# Patient Record
Sex: Female | Born: 2000 | Race: White | Hispanic: No | Marital: Single | Smoking: Current every day smoker
Health system: Southern US, Community
[De-identification: ages and names within clinical notes are randomized; demographics above are authoritative.]

## PROBLEM LIST (undated history)

## (undated) DIAGNOSIS — I498 Other specified cardiac arrhythmias: Secondary | ICD-10-CM

## (undated) DIAGNOSIS — F988 Other specified behavioral and emotional disorders with onset usually occurring in childhood and adolescence: Secondary | ICD-10-CM

## (undated) DIAGNOSIS — S8992XA Unspecified injury of left lower leg, initial encounter: Secondary | ICD-10-CM

## (undated) DIAGNOSIS — Z8709 Personal history of other diseases of the respiratory system: Secondary | ICD-10-CM

## (undated) HISTORY — DX: Unspecified injury of left lower leg, initial encounter: S89.92XA

## (undated) HISTORY — PX: NO PAST SURGERIES: SHX2092

## (undated) HISTORY — DX: Other specified behavioral and emotional disorders with onset usually occurring in childhood and adolescence: F98.8

## (undated) HISTORY — DX: Other specified cardiac arrhythmias: I49.8

## (undated) HISTORY — PX: CHOLECYSTECTOMY: SHX55

## (undated) HISTORY — DX: Personal history of other diseases of the respiratory system: Z87.09

---
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
## 2020-01-14 DIAGNOSIS — M25572 Pain in left ankle and joints of left foot: Secondary | ICD-10-CM | POA: Diagnosis not present

## 2020-01-19 DIAGNOSIS — R197 Diarrhea, unspecified: Secondary | ICD-10-CM | POA: Diagnosis not present

## 2020-01-19 DIAGNOSIS — R112 Nausea with vomiting, unspecified: Secondary | ICD-10-CM | POA: Diagnosis not present

## 2020-02-09 ENCOUNTER — Inpatient Hospital Stay (HOSPITAL_COMMUNITY)
Admission: AD | Admit: 2020-02-09 | Discharge: 2020-02-09 | Disposition: A | Discharge status: Home / Self Care | Payer: Medicaid Other | Attending: Obstetrics and Gynecology | Admitting: Obstetrics and Gynecology

## 2020-02-09 ENCOUNTER — Inpatient Hospital Stay (HOSPITAL_COMMUNITY): Payer: Medicaid Other

## 2020-02-09 ENCOUNTER — Other Ambulatory Visit: Payer: Self-pay

## 2020-02-09 ENCOUNTER — Encounter (HOSPITAL_COMMUNITY): Payer: Self-pay | Admitting: Obstetrics and Gynecology

## 2020-02-09 DIAGNOSIS — R42 Dizziness and giddiness: Secondary | ICD-10-CM | POA: Insufficient documentation

## 2020-02-09 DIAGNOSIS — O26899 Other specified pregnancy related conditions, unspecified trimester: Secondary | ICD-10-CM

## 2020-02-09 DIAGNOSIS — O26891 Other specified pregnancy related conditions, first trimester: Secondary | ICD-10-CM | POA: Diagnosis not present

## 2020-02-09 DIAGNOSIS — Z3A00 Weeks of gestation of pregnancy not specified: Secondary | ICD-10-CM | POA: Diagnosis not present

## 2020-02-09 DIAGNOSIS — O99331 Smoking (tobacco) complicating pregnancy, first trimester: Secondary | ICD-10-CM | POA: Insufficient documentation

## 2020-02-09 DIAGNOSIS — Z6790 Unspecified blood type, Rh positive: Secondary | ICD-10-CM | POA: Insufficient documentation

## 2020-02-09 DIAGNOSIS — Z3A01 Less than 8 weeks gestation of pregnancy: Secondary | ICD-10-CM | POA: Diagnosis not present

## 2020-02-09 DIAGNOSIS — O3680X0 Pregnancy with inconclusive fetal viability, not applicable or unspecified: Secondary | ICD-10-CM | POA: Diagnosis not present

## 2020-02-09 DIAGNOSIS — Z79899 Other long term (current) drug therapy: Secondary | ICD-10-CM | POA: Diagnosis not present

## 2020-02-09 DIAGNOSIS — F17210 Nicotine dependence, cigarettes, uncomplicated: Secondary | ICD-10-CM | POA: Diagnosis not present

## 2020-02-09 DIAGNOSIS — R109 Unspecified abdominal pain: Secondary | ICD-10-CM

## 2020-02-09 LAB — COMPREHENSIVE METABOLIC PANEL
ALT: 18 U/L (ref 0–44)
AST: 17 U/L (ref 15–41)
Albumin: 3.8 g/dL (ref 3.5–5.0)
Alkaline Phosphatase: 44 U/L (ref 38–126)
Anion gap: 10 (ref 5–15)
BUN: 5 mg/dL — ABNORMAL LOW (ref 6–20)
CO2: 22 mmol/L (ref 22–32)
Calcium: 8.5 mg/dL — ABNORMAL LOW (ref 8.9–10.3)
Chloride: 106 mmol/L (ref 98–111)
Creatinine, Ser: 0.72 mg/dL (ref 0.44–1.00)
GFR calc Af Amer: >60 mL/min (ref >60)
GFR calc non Af Amer: >60 mL/min (ref >60)
Glucose, Bld: 120 mg/dL — ABNORMAL HIGH (ref 70–99)
Potassium: 3.6 mmol/L (ref 3.5–5.1)
Sodium: 138 mmol/L (ref 135–145)
Total Bilirubin: 0.4 mg/dL (ref 0.3–1.2)
Total Protein: 6.4 g/dL — ABNORMAL LOW (ref 6.5–8.1)

## 2020-02-09 LAB — POCT PREGNANCY, URINE: Preg Test, Ur: POSITIVE — AB

## 2020-02-09 LAB — ABO/RH: ABO/RH(D): O POS

## 2020-02-09 LAB — CBC
HCT: 39 % (ref 36.0–46.0)
Hemoglobin: 12.7 g/dL (ref 12.0–15.0)
MCH: 28.9 pg (ref 26.0–34.0)
MCHC: 32.6 g/dL (ref 30.0–36.0)
MCV: 88.6 fL (ref 80.0–100.0)
Platelets: 200 10*3/uL (ref 150–400)
RBC: 4.4 MIL/uL (ref 3.87–5.11)
RDW: 12.2 % (ref 11.5–15.5)
WBC: 6.6 10*3/uL (ref 4.0–10.5)
nRBC: 0 % (ref 0.0–0.2)

## 2020-02-09 LAB — WET PREP, GENITAL
Clue Cells Wet Prep HPF POC: NONE SEEN
Sperm: NONE SEEN
Trich, Wet Prep: NONE SEEN
WBC, Wet Prep HPF POC: NONE SEEN
Yeast Wet Prep HPF POC: NONE SEEN

## 2020-02-09 LAB — HCG, QUANTITATIVE, PREGNANCY: hCG, Beta Chain, Quant, S: 355 m[IU]/mL — ABNORMAL HIGH (ref <5)

## 2020-02-09 NOTE — MAU Note (Signed)
Pt is G1P0 who has had abdominal since Tuesday evening. Denies bleeding, has clear vaginal discharge. Also reporting feeling lightheaded since yesterday. This happens at work and she's on her feet 10 hours a day.

## 2020-02-09 NOTE — Discharge Instructions (Signed)
First Trimester of Pregnancy The first trimester of pregnancy is from week 1 until the end of week 13 (months 1 through 3). A week after a sperm fertilizes an egg, the egg will implant on the wall of the uterus. This embryo will begin to develop into a baby. Genes from you and your partner will form the baby. The female genes will determine whether the baby will be a boy or a girl. At 6-8 weeks, the eyes and face will be formed, and the heartbeat can be seen on ultrasound. At the end of 12 weeks, all the baby's organs will be formed. Now that you are pregnant, you will want to do everything you can to have a healthy baby. Two of the most important things are to get good prenatal care and to follow your health care provider's instructions. Prenatal care is all the medical care you receive before the baby's birth. This care will help prevent, find, and treat any problems during the pregnancy and childbirth. Body changes during your first trimester Your body goes through many changes during pregnancy. The changes vary from woman to woman.  You may gain or lose a couple of pounds at first.  You may feel sick to your stomach (nauseous) and you may throw up (vomit). If the vomiting is uncontrollable, call your health care provider.  You may tire easily.  You may develop headaches that can be relieved by medicines. All medicines should be approved by your health care provider.  You may urinate more often. Painful urination may mean you have a bladder infection.  You may develop heartburn as a result of your pregnancy.  You may develop constipation because certain hormones are causing the muscles that push stool through your intestines to slow down.  You may develop hemorrhoids or swollen veins (varicose veins).  Your breasts may begin to grow larger and become tender. Your nipples may stick out more, and the tissue that surrounds them (areola) may become darker.  Your gums may bleed and may be  sensitive to brushing and flossing.  Dark spots or blotches (chloasma, mask of pregnancy) may develop on your face. This will likely fade after the baby is born.  Your menstrual periods will stop.  You may have a loss of appetite.  You may develop cravings for certain kinds of food.  You may have changes in your emotions from day to day, such as being excited to be pregnant or being concerned that something may go wrong with the pregnancy and baby.  You may have more vivid and strange dreams.  You may have changes in your hair. These can include thickening of your hair, rapid growth, and changes in texture. Some women also have hair loss during or after pregnancy, or hair that feels dry or thin. Your hair will most likely return to normal after your baby is born. What to expect at prenatal visits During a routine prenatal visit:  You will be weighed to make sure you and the baby are growing normally.  Your blood pressure will be taken.  Your abdomen will be measured to track your baby's growth.  The fetal heartbeat will be listened to between weeks 10 and 14 of your pregnancy.  Test results from any previous visits will be discussed. Your health care provider may ask you:  How you are feeling.  If you are feeling the baby move.  If you have had any abnormal symptoms, such as leaking fluid, bleeding, severe headaches, or abdominal   cramping.  If you are using any tobacco products, including cigarettes, chewing tobacco, and electronic cigarettes.  If you have any questions. Other tests that may be performed during your first trimester include:  Blood tests to find your blood type and to check for the presence of any previous infections. The tests will also be used to check for low iron levels (anemia) and protein on red blood cells (Rh antibodies). Depending on your risk factors, or if you previously had diabetes during pregnancy, you may have tests to check for high blood sugar  that affects pregnant women (gestational diabetes).  Urine tests to check for infections, diabetes, or protein in the urine.  An ultrasound to confirm the proper growth and development of the baby.  Fetal screens for spinal cord problems (spina bifida) and Down syndrome.  HIV (human immunodeficiency virus) testing. Routine prenatal testing includes screening for HIV, unless you choose not to have this test.  You may need other tests to make sure you and the baby are doing well. Follow these instructions at home: Medicines  Follow your health care provider's instructions regarding medicine use. Specific medicines may be either safe or unsafe to take during pregnancy.  Take a prenatal vitamin that contains at least 600 micrograms (mcg) of folic acid.  If you develop constipation, try taking a stool softener if your health care provider approves. Eating and drinking   Eat a balanced diet that includes fresh fruits and vegetables, whole grains, good sources of protein such as meat, eggs, or tofu, and low-fat dairy. Your health care provider will help you determine the amount of weight gain that is right for you.  Avoid raw meat and uncooked cheese. These carry germs that can cause birth defects in the baby.  Eating four or five small meals rather than three large meals a day may help relieve nausea and vomiting. If you start to feel nauseous, eating a few soda crackers can be helpful. Drinking liquids between meals, instead of during meals, also seems to help ease nausea and vomiting.  Limit foods that are high in fat and processed sugars, such as fried and sweet foods.  To prevent constipation: ? Eat foods that are high in fiber, such as fresh fruits and vegetables, whole grains, and beans. ? Drink enough fluid to keep your urine clear or pale yellow. Activity  Exercise only as directed by your health care provider. Most women can continue their usual exercise routine during  pregnancy. Try to exercise for 30 minutes at least 5 days a week. Exercising will help you: ? Control your weight. ? Stay in shape. ? Be prepared for labor and delivery.  Experiencing pain or cramping in the lower abdomen or lower back is a good sign that you should stop exercising. Check with your health care provider before continuing with normal exercises.  Try to avoid standing for long periods of time. Move your legs often if you must stand in one place for a long time.  Avoid heavy lifting.  Wear low-heeled shoes and practice good posture.  You may continue to have sex unless your health care provider tells you not to. Relieving pain and discomfort  Wear a good support bra to relieve breast tenderness.  Take warm sitz baths to soothe any pain or discomfort caused by hemorrhoids. Use hemorrhoid cream if your health care provider approves.  Rest with your legs elevated if you have leg cramps or low back pain.  If you develop varicose veins in   your legs, wear support hose. Elevate your feet for 15 minutes, 3-4 times a day. Limit salt in your diet. Prenatal care  Schedule your prenatal visits by the twelfth week of pregnancy. They are usually scheduled monthly at first, then more often in the last 2 months before delivery.  Write down your questions. Take them to your prenatal visits.  Keep all your prenatal visits as told by your health care provider. This is important. Safety  Wear your seat belt at all times when driving.  Make a list of emergency phone numbers, including numbers for family, friends, the hospital, and police and fire departments. General instructions  Ask your health care provider for a referral to a local prenatal education class. Begin classes no later than the beginning of month 6 of your pregnancy.  Ask for help if you have counseling or nutritional needs during pregnancy. Your health care provider can offer advice or refer you to specialists for help  with various needs.  Do not use hot tubs, steam rooms, or saunas.  Do not douche or use tampons or scented sanitary pads.  Do not cross your legs for long periods of time.  Avoid cat litter boxes and soil used by cats. These carry germs that can cause birth defects in the baby and possibly loss of the fetus by miscarriage or stillbirth.  Avoid all smoking, herbs, alcohol, and medicines not prescribed by your health care provider. Chemicals in these products affect the formation and growth of the baby.  Do not use any products that contain nicotine or tobacco, such as cigarettes and e-cigarettes. If you need help quitting, ask your health care provider. You may receive counseling support and other resources to help you quit.  Schedule a dentist appointment. At home, brush your teeth with a soft toothbrush and be gentle when you floss. Contact a health care provider if:  You have dizziness.  You have mild pelvic cramps, pelvic pressure, or nagging pain in the abdominal area.  You have persistent nausea, vomiting, or diarrhea.  You have a bad smelling vaginal discharge.  You have pain when you urinate.  You notice increased swelling in your face, hands, legs, or ankles.  You are exposed to fifth disease or chickenpox.  You are exposed to [place] measles (rubella) and have never had it. Get help right away if:  You have a fever.  You are leaking fluid from your vagina.  You have spotting or bleeding from your vagina.  You have severe abdominal cramping or pain.  You have rapid weight gain or loss.  You vomit blood or material that looks like coffee grounds.  You develop a severe headache.  You have shortness of breath.  You have any kind of trauma, such as from a fall or a car accident. Summary  The first trimester of pregnancy is from week 1 until the end of week 13 (months 1 through 3).  Your body goes through many changes during pregnancy. The changes vary from  woman to woman.  You will have routine prenatal visits. During those visits, your health care provider will examine you, discuss any test results you may have, and talk with you about how you are feeling. This information is not intended to replace advice given to you by your health care provider. Make sure you discuss any questions you have with your health care provider. Document Revised: 08/21/2017 Document Reviewed: 08/20/2016 Elsevier Patient Education  2020 [employer].  Ectopic Pregnancy  An ectopic pregnancy is when the fertilized egg attaches (implants) outside the uterus. Most ectopic pregnancies occur in one of the tubes where eggs travel from the ovary to the uterus (fallopian tubes), but the implanting can occur in other locations. In rare cases, ectopic pregnancies occur on the ovary, intestine, pelvis, abdomen, or cervix. In an ectopic pregnancy, the fertilized egg does not have the ability to develop into a normal, healthy baby. A ruptured ectopic pregnancy is one in which tearing or bursting of a fallopian tube causes internal bleeding. Often, there is intense lower abdominal pain, and vaginal bleeding sometimes occurs. Having an ectopic pregnancy can be life-threatening. If this dangerous condition is not treated, it can lead to blood loss, shock, or even death. What are the causes? The most common cause of this condition is damage to one of the fallopian tubes. A fallopian tube may be narrowed or blocked, and that keeps the fertilized egg from reaching the uterus. What increases the risk? This condition is more likely to develop in women of childbearing age who have different levels of risk. The levels of risk can be divided into three categories. High risk  You have gone through infertility treatment.  You have had an ectopic pregnancy before.  You have had surgery on the fallopian tubes, or another surgical procedure, such as an abortion.  You have had  surgery to have the fallopian tubes tied (tubal ligation).  You have problems or diseases of the fallopian tubes.  You have been exposed to diethylstilbestrol (DES). This medicine was used until 1971, and it had effects on babies whose mothers took the medicine.  You become pregnant while using an IUD (intrauterine device) for birth control. Moderate risk  You have a history of infertility.  You have had an STI (sexually transmitted infection).  You have a history of pelvic inflammatory disease (PID).  You have scarring from endometriosis.  You have multiple sexual partners.  You smoke. Low risk  You have had pelvic surgery.  You use vaginal douches.  You became sexually active before age 36. What are the signs or symptoms? Common symptoms of this condition include normal pregnancy symptoms, such as missing a period, nausea, tiredness, abdominal pain, breast tenderness, and bleeding. However, ectopic pregnancy will have additional symptoms, such as:  Pain with intercourse.  Irregular vaginal bleeding or spotting.  Cramping or pain on one side or in the lower abdomen.  Fast heartbeat, low blood pressure, and sweating.  Passing out while having a bowel movement. Symptoms of a ruptured ectopic pregnancy and internal bleeding may include:  Sudden, severe pain in the abdomen and pelvis.  Dizziness, weakness, light-headedness, or fainting.  Pain in the shoulder or neck area. How is this diagnosed? This condition is diagnosed by:  A pelvic exam to locate pain or a mass in the abdomen.  A pregnancy test. This blood test checks for the presence as well as the specific level of pregnancy hormone in the bloodstream.  Ultrasound. This is performed if a pregnancy test is positive. In this test, a probe is inserted into the vagina. The probe will detect a fetus, possibly in a location other than the uterus.  Taking a sample of uterus tissue (dilation and curettage, or  D&C).  Surgery to perform a visual exam of the inside of the abdomen using a thin, lighted tube that has a tiny camera on the end (laparoscope).  Culdocentesis. This procedure involves inserting a needle at the top  of the vagina, behind the uterus. If blood is present in this area, it may indicate that a fallopian tube is torn. How is this treated? This condition is treated with medicine or surgery. Medicine  An injection of a medicine (methotrexate) may be given to cause the pregnancy tissue to be absorbed. This medicine may save your fallopian tube. It may be given if: ? The diagnosis is made early, with no signs of active bleeding. ? The fallopian tube has not ruptured. ? You are considered to be a good candidate for the medicine. Usually, pregnancy hormone blood levels are checked after methotrexate treatment. This is to be sure that the medicine is effective. It may take 4-6 weeks for the pregnancy to be absorbed. Most pregnancies will be absorbed by 3 weeks. Surgery  A laparoscope may be used to remove the pregnancy tissue.  If severe internal bleeding occurs, a larger cut (incision) may be made in the lower abdomen (laparotomy) to remove the fetus and placenta. This is done to stop the bleeding.  Part or all of the fallopian tube may be removed (salpingectomy) along with the fetus and placenta. The fallopian tube may also be repaired during the surgery.  In very rare circumstances, removal of the uterus (hysterectomy) may be required.  After surgery, pregnancy hormone testing may be done to be sure that there is no pregnancy tissue left. Whether your treatment is medicine or surgery, you may receive a Rho (D) immune globulin shot to prevent problems with any future pregnancy. This shot may be given if:  You are Rh-negative and the baby's father is Rh-positive.  You are Rh-negative and you do not know the Rh type of the baby's father. Follow these instructions at home:  Rest and  limit your activity after the procedure for as long as told by your health care provider.  Until your health care provider says that it is safe: ? Do not lift anything that is heavier than 10 lb (4.5 kg), or the limit that your health care provider tells you. ? Avoid physical exercise and any movement that requires effort (is strenuous).  To help prevent constipation: ? Eat a healthy diet that includes fruits, vegetables, and whole grains. ? Drink 6-8 glasses of water per day. Get help right away if:  You develop worsening pain that is not relieved by medicine.  You have: ? A fever or chills. ? Vaginal bleeding. ? Redness and swelling at the incision site. ? Nausea and vomiting.  You feel dizzy or weak.  You feel light-headed or you faint. This information is not intended to replace advice given to you by your health care provider. Make sure you discuss any questions you have with your health care provider. Document Revised: 08/21/2017 Document Reviewed: 04/09/2016 Elsevier Patient Education  2020 [employer].       Miscarriage A miscarriage is the loss of an unborn baby (fetus) before the 20th week of pregnancy. Most miscarriages happen during the first 3 months of pregnancy. Sometimes, a miscarriage can happen before a woman knows that she is pregnant. Having a miscarriage can be an emotional experience. If you have had a miscarriage, talk with your health care provider about any questions you may have about miscarrying, the grieving process, and your plans for future pregnancy. What are the causes? A miscarriage may be caused by:  Problems with the genes or chromosomes of the fetus. These problems make it impossible for the baby to develop normally. They are often  the result of random errors that occur early in the development of the baby, and are not passed from parent to child (not inherited).  Infection of the cervix or uterus.  Conditions that affect hormone balance  in the body.  Problems with the cervix, such as the cervix opening and thinning before pregnancy is at term (cervical insufficiency).  Problems with the uterus. These may include: ? A uterus with an abnormal shape. ? Fibroids in the uterus. ? Congenital abnormalities. These are problems that were present at birth.  Certain medical conditions.  Smoking, drinking alcohol, or using drugs.  Injury (trauma). In many cases, the cause of a miscarriage is not known. What are the signs or symptoms? Symptoms of this condition include:  Vaginal bleeding or spotting, with or without cramps or pain.  Pain or cramping in the abdomen or lower back.  Passing fluid, tissue, or blood clots from the vagina. How is this diagnosed? This condition may be diagnosed based on:  A physical exam.  Ultrasound.  Blood tests.  Urine tests. How is this treated? Treatment for a miscarriage is sometimes not necessary if you naturally pass all the tissue that was in your uterus. If necessary, this condition may be treated with:  Dilation and curettage (D&C). This is a procedure in which the cervix is stretched open and the lining of the uterus (endometrium) is scraped. This is done only if tissue from the fetus or placenta remains in the body (incomplete miscarriage).  Medicines, such as: ? Antibiotic medicine, to treat infection. ? Medicine to help the body pass any remaining tissue. ? Medicine to reduce (contract) the size of the uterus. These medicines may be given if you have a lot of bleeding. If you have Rh negative blood and your baby was Rh positive, you will need a shot of a medicine called Rh immunoglobulinto protect your future babies from Rh blood problems. "Rh-negative" and "Rh-positive" refer to whether or not the blood has a specific protein found on the surface of red blood cells (Rh factor). Follow these instructions at home: Medicines   Take over-the-counter and prescription medicines  only as told by your health care provider.  If you were prescribed antibiotic medicine, take it as told by your health care provider. Do not stop taking the antibiotic even if you start to feel better.  Do not take NSAIDs, such as aspirin and ibuprofen, unless they are approved by your health care provider. These medicines can cause bleeding. Activity  Rest as directed. Ask your health care provider what activities are safe for you.  Have someone help with home and family responsibilities during this time. General instructions  Keep track of the number of sanitary pads you use each day and how soaked (saturated) they are. Write down this information.  Monitor the amount of tissue or blood clots that you pass from your vagina. Save any large amounts of tissue for your health care provider to examine.  Do not use tampons, douche, or have sex until your health care provider approves.  To help you and your partner with the process of grieving, talk with your health care provider or seek counseling.  When you are ready, meet with your health care provider to discuss any important steps you should take for your health. Also, discuss steps you should take to have a healthy pregnancy in the future.  Keep all follow-up visits as told by your health care provider. This is important. Where to find more information  The [employer]: www.acog.org  [place] Department of Health and [employer] Office of Women's Health: http://hoffman.com/www.womenshealth.gov Contact a health care provider if:  You have a fever or chills.  You have a foul smelling vaginal discharge.  You have more bleeding instead of less. Get help right away if:  You have severe cramps or pain in your back or abdomen.  You pass blood clots or tissue from your vagina that is walnut-sized or larger.  You soak more than 1 regular sanitary pad in an hour.  You become light-headed or weak.  You pass  out.  You have feelings of sadness that take over your thoughts, or you have thoughts of hurting yourself. Summary  Most miscarriages happen in the first 3 months of pregnancy. Sometimes miscarriage happens before a woman even knows that she is pregnant.  Follow your health care provider's instruction for home care. Keep all follow-up appointments.  To help you and your partner with the process of grieving, talk with your health care provider or seek counseling. This information is not intended to replace advice given to you by your health care provider. Make sure you discuss any questions you have with your health care provider. Document Revised: 12/31/2018 Document Reviewed: 10/14/2016 Elsevier Patient Education  2020 [employer] Inc.         Abdominal Pain During Pregnancy  Abdominal pain is common during pregnancy, and has many possible causes. Some causes are more serious than others, and sometimes the cause is not known. Abdominal pain can be a sign that labor is starting. It can also be caused by normal growth and stretching of muscles and ligaments during pregnancy. Always tell your health care provider if you have any abdominal pain. Follow these instructions at home:  Do not have sex or put anything in your vagina until your pain goes away completely.  Get plenty of rest until your pain improves.  Drink enough fluid to keep your urine pale yellow.  Take over-the-counter and prescription medicines only as told by your health care provider.  Keep all follow-up visits as told by your health care provider. This is important. Contact a health care provider if:  Your pain continues or gets worse after resting.  You have lower abdominal pain that: ? Comes and goes at regular intervals. ? Spreads to your back. ? Is similar to menstrual cramps.  You have pain or burning when you urinate. Get help right away if:  You have a fever or chills.  You have vaginal  bleeding.  You are leaking fluid from your vagina.  You are passing tissue from your vagina.  You have vomiting or diarrhea that lasts for more than 24 hours.  Your baby is moving less than usual.  You feel very weak or faint.  You have shortness of breath.  You develop severe pain in your upper abdomen. Summary  Abdominal pain is common during pregnancy, and has many possible causes.  If you experience abdominal pain during pregnancy, tell your health care provider right away.  Follow your health care provider's home care instructions and keep all follow-up visits as directed. This information is not intended to replace advice given to you by your health care provider. Make sure you discuss any questions you have with your health care provider. Document Revised: 12/27/2018 Document Reviewed: 12/11/2016 Elsevier Patient Education  2020 [employer]  Safe Medications in Pregnancy    Acne: Benzoyl Peroxide Salicylic Acid  Backache/Headache: Tylenol: 2 regular strength every 4 hours OR              2 Extra strength every 6 hours  Colds/Coughs/Allergies: Benadryl (alcohol free) 25 mg every 6 hours as needed Breath right strips Claritin Cepacol throat lozenges Chloraseptic throat spray Cold-Eeze- up to three times per day Cough drops, alcohol free Flonase (by prescription only) Guaifenesin Mucinex Robitussin DM (plain only, alcohol free) Saline nasal spray/drops Sudafed (pseudoephedrine) & Actifed ** use only after [redacted] weeks gestation and if you do not have high blood pressure Tylenol Vicks Vaporub Zinc lozenges Zyrtec   Constipation: Colace Ducolax suppositories Fleet enema Glycerin suppositories Metamucil Milk of magnesia Miralax Senokot Smooth move tea  Diarrhea: Kaopectate Imodium A-D  *NO pepto Bismol  Hemorrhoids: Anusol Anusol HC Preparation H Tucks  Indigestion: Tums Maalox Mylanta Zantac   Pepcid  Insomnia: Benadryl (alcohol free) 25mg  every 6 hours as needed Tylenol PM Unisom, no Gelcaps  Leg Cramps: Tums MagGel  Nausea/Vomiting:  Bonine Dramamine Emetrol Ginger extract Sea bands Meclizine  Nausea medication to take during pregnancy:  Unisom (doxylamine succinate 25 mg tablets) Take one tablet daily at bedtime. If symptoms are not adequately controlled, the dose can be increased to a maximum recommended dose of two tablets daily (1/2 tablet in the morning, 1/2 tablet mid-afternoon and one at bedtime). Vitamin B6 100mg  tablets. Take one tablet twice a day (up to 200 mg per day).  Skin Rashes: Aveeno products Benadryl cream or 25mg  every 6 hours as needed Calamine Lotion 1% cortisone cream  Yeast infection: Gyne-lotrimin 7 Monistat 7   **If taking multiple medications, please check labels to avoid duplicating the same active ingredients **take medication as directed on the label ** Do not exceed 4000 mg of tylenol in 24 hours **Do not take medications that contain aspirin or ibuprofen         Eating Plan for Pregnant Women While you are pregnant, your body requires additional nutrition to help support your growing baby. You also have a higher need for some vitamins and minerals, such as folic acid, calcium, iron, and vitamin D. Eating a healthy, well-balanced diet is very important for your health and your baby's health. Your need for extra calories varies for the three 64-month segments of your pregnancy (trimesters). For most women, it is recommended to consume:  150 extra calories a day during the first trimester.  300 extra calories a day during the second trimester.  300 extra calories a day during the third trimester. What are tips for following this plan?   Do not try to lose weight or go on a diet during pregnancy.  Limit your overall intake of foods that have "empty calories." These are foods that have little nutritional value, such as  sweets, desserts, candies, and sugar-sweetened beverages.  Eat a variety of foods (especially fruits and vegetables) to get a full range of vitamins and minerals.  Take a prenatal vitamin to help meet your additional vitamin and mineral needs during pregnancy, specifically for folic acid, iron, calcium, and vitamin D.  Remember to stay active. Ask your health care provider what types of exercise and activities are safe for you.  Practice good food safety and cleanliness. Wash your hands before you eat and after you prepare raw meat. Wash all fruits and vegetables well before peeling or eating. Taking these actions can help to prevent food-borne illnesses that can be  very dangerous to your baby, such as listeriosis. Ask your health care provider for more information about listeriosis. What does 150 extra calories look like? Healthy options that provide 150 extra calories each day could be any of the following:  6-8 oz (170-230 g) of plain low-fat yogurt with  cup of berries.  1 apple with 2 teaspoons (11 g) of peanut butter.  Cut-up vegetables with  cup (60 g) of hummus.  8 oz (230 mL) or 1 cup of low-fat chocolate milk.  1 stick of string cheese with 1 medium orange.  1 peanut butter and jelly sandwich that is made with one slice of whole-wheat bread and 1 tsp (5 g) of peanut butter. For 300 extra calories, you could eat two of those healthy options each day. What is a healthy amount of weight to gain? The right amount of weight gain for you is based on your BMI before you became pregnant. If your BMI:  Was less than 18 (underweight), you should gain 28-40 lb (13-18 kg).  Was 18-24.9 (normal), you should gain 25-35 lb (11-16 kg).  Was 25-29.9 (overweight), you should gain 15-25 lb (7-11 kg).  Was 30 or greater (obese), you should gain 11-20 lb (5-9 kg). What if I am having twins or multiples? Generally, if you are carrying twins or multiples:  You may need to eat 300-600 extra  calories a day.  The recommended range for total weight gain is 25-54 lb (11-25 kg), depending on your BMI before pregnancy.  Talk with your health care provider to find out about nutritional needs, weight gain, and exercise that is right for you. What foods can I eat?  Fruits All fruits. Eat a variety of colors and types of fruit. Remember to wash your fruits well before peeling or eating. Vegetables All vegetables. Eat a variety of colors and types of vegetables. Remember to wash your vegetables well before peeling or eating. Grains All grains. Choose whole grains, such as whole-wheat bread, oatmeal, or brown rice. Meats and other protein foods Lean meats, including chicken, [place], fish, and lean cuts of beef, veal, or pork. If you eat fish or seafood, choose options that are higher in omega-3 fatty acids and lower in mercury, such as salmon, herring, mussels, trout, sardines, pollock, shrimp, crab, and lobster. Tofu. Tempeh. Beans. Eggs. Peanut butter and other nut butters. Make sure that all meats, poultry, and eggs are cooked to food-safe temperatures or "well-done." Two or more servings of fish are recommended each week in order to get the most benefits from omega-3 fatty acids that are found in seafood. Choose fish that are lower in mercury. You can find more information online:  GuamGaming.ch Dairy Pasteurized milk and milk alternatives (such as almond milk). Pasteurized yogurt and pasteurized cheese. Cottage cheese. Sour cream. Beverages Water. Juices that contain 100% fruit juice or vegetable juice. Caffeine-free teas and decaffeinated coffee. Drinks that contain caffeine are okay to drink, but it is better to avoid caffeine. Keep your total caffeine intake to less than 200 mg each day (which is 12 oz or 355 mL of coffee, tea, or soda) or the limit as told by your health care provider. Fats and oils Fats and oils are okay to include in moderation. Sweets and desserts Sweets and  desserts are okay to include in moderation. Seasoning and other foods All pasteurized condiments. The items listed above may not be a complete list of foods and beverages you can eat. Contact a dietitian for more information.  What foods are not recommended? Fruits Unpasteurized fruit juices. Vegetables Raw (unpasteurized) vegetable juices. Meats and other protein foods Lunch meats, bologna, hot dogs, or other deli meats. (If you must eat those meats, reheat them until they are steaming hot.) Refrigerated pat, meat spreads from a meat counter, smoked seafood that is found in the refrigerated section of a store. Raw or undercooked meats, poultry, and eggs. Raw fish, such as sushi or sashimi. Fish that have high mercury content, such as tilefish, shark, swordfish, and king mackerel. To learn more about mercury in fish, talk with your health care provider or look for online resources, such as:  PumpkinSearch.com.ee Dairy Raw (unpasteurized) milk and any foods that have raw milk in them. Soft cheeses, such as feta, queso blanco, queso fresco, Brie, Camembert cheeses, blue-veined cheeses, and Panela cheese (unless it is made with pasteurized milk, which must be stated on the label). Beverages Alcohol. Sugar-sweetened beverages, such as sodas, teas, or energy drinks. Seasoning and other foods Homemade fermented foods and drinks, such as pickles, sauerkraut, or kombucha drinks. (Store-bought pasteurized versions of these are okay.) Salads that are made in a store or deli, such as ham salad, chicken salad, egg salad, tuna salad, and seafood salad. The items listed above may not be a complete list of foods and beverages you should avoid. Contact a dietitian for more information. Where to find more information To calculate the number of calories you need based on your height, weight, and activity level, you can use an online calculator such as:  PackageNews.is To calculate how much weight  you should gain during pregnancy, you can use an online pregnancy weight gain calculator such as:  http://jones-berg.com/ Summary  While you are pregnant, your body requires additional nutrition to help support your growing baby.  Eat a variety of foods, especially fruits and vegetables to get a full range of vitamins and minerals.  Practice good food safety and cleanliness. Wash your hands before you eat and after you prepare raw meat. Wash all fruits and vegetables well before peeling or eating. Taking these actions can help to prevent food-borne illnesses, such as listeriosis, that can be very dangerous to your baby.  Do not eat raw meat or fish. Do not eat fish that have high mercury content, such as tilefish, shark, swordfish, and king mackerel. Do not eat unpasteurized (raw) dairy.  Take a prenatal vitamin to help meet your additional vitamin and mineral needs during pregnancy, specifically for folic acid, iron, calcium, and vitamin D. This information is not intended to replace advice given to you by your health care provider. Make sure you discuss any questions you have with your health care provider. Document Revised: 01/27/2019 Document Reviewed: 06/05/2017 Elsevier Patient Education  2020 [employer].

## 2020-02-09 NOTE — MAU Provider Note (Signed)
History     CSN: [id number]  Arrival date and time: 02/09/20 7741   First Provider Initiated Contact with Patient 02/09/20 1040      Chief Complaint  Patient presents with  . Abdominal Cramping  . Dizziness   Kayla Church is a 19 y.o. G1P0 at [redacted]w[redacted]d who presents to MAU for abdominal cramping.  Onset: 3 days Location: epigastric region  Duration: 3 days Character: "period-like cramps" but more often, pt reports this is the area she feels cramping with her menstrual cycles Aggravating/Associated: none/lightheadedness when standing at work for 10 hours Relieving: none Treatment: Tylenol - sometimes Severity: 0/10 - pt reports pain is not present at this time  Pt denies VB, vaginal discharge/odor/itching. Pt denies N/V, constipation, diarrhea, or urinary problems. Pt denies fever, chills, fatigue, sweating or changes in appetite. Pt denies SOB or chest pain. Pt denies dizziness, HA, weakness.  Problems this pregnancy include: pt has not yet been seen. Allergies? NKDA Current medications/supplements? PNVs Prenatal care provider? [facility], NOB Tuesday 02/14/2020   OB History    Gravida  1   Para      Term      Preterm      AB      Living        SAB      TAB      Ectopic      Multiple      Live Births              Past Medical History:  Diagnosis Date  . ADD (attention deficit disorder)    with inadequate control  . History of URI (upper respiratory infection)   . Left knee injury 4/12   bruise   . Sinus arrhythmia     History reviewed. No pertinent surgical history.  Family History  Problem Relation Age of Onset  . Bipolar disorder Father     Social History   Tobacco Use  . Smoking status: Current Every Day Smoker    Packs/day: 0.25    Types: Cigarettes  . Smokeless tobacco: Never Used  . Tobacco comment: pt reports she has had a total of 6 cigarettes since 02/07/20  Substance Use Topics  . Alcohol use: No  . Drug use: Yes     Types: Marijuana    Comment: pt reports she stopped 5/18    Allergies: No Known Allergies  Medications Prior to Admission  Medication Sig Dispense Refill Last Dose  . Prenatal Vit-Fe Fumarate-FA (PRENATAL MULTIVITAMIN) TABS tablet Take 1 tablet by mouth daily at 12 noon.     . citalopram (CELEXA) 20 MG tablet Take 1 tablet (20 mg total) by mouth daily. 30 tablet 5   . levonorgestrel-ethinyl estradiol (AVIANE) 0.1-20 MG-MCG tablet Take 1 tablet by mouth daily. 1 Package 11   . lisdexamfetamine (VYVANSE) 50 MG capsule Take 1 capsule (50 mg total) by mouth daily. 30 capsule 0   . lisdexamfetamine (VYVANSE) 50 MG capsule Take 1 capsule (50 mg total) by mouth daily. TAKE 1 CAPSULE IN THE MORNING 30 capsule 0   . lisdexamfetamine (VYVANSE) 50 MG capsule Take 1 capsule (50 mg total) by mouth every morning. 30 capsule 0     Review of Systems  Constitutional: Negative for chills, diaphoresis, fatigue and fever.  Eyes: Negative for visual disturbance.  Respiratory: Negative for shortness of breath.   Cardiovascular: Negative for chest pain.  Gastrointestinal: Positive for abdominal pain. Negative for constipation, diarrhea, nausea and vomiting.  Genitourinary:  Negative for dysuria, flank pain, frequency, pelvic pain, urgency, vaginal bleeding and vaginal discharge.  Neurological: Positive for light-headedness. Negative for dizziness, weakness and headaches.   Physical Exam   Blood pressure 122/67, pulse 72, temperature 98.4 F (36.9 C), temperature source Oral, resp. rate 19, height 5\' 2"  (1.575 m), weight 71.7 kg, last menstrual period 01/02/2020, SpO2 100 %.  Patient Vitals for the past 24 hrs:  BP Temp Temp src Pulse Resp SpO2 Height Weight  02/09/20 1021 122/67 98.4 F (36.9 C) Oral 72 19 100 % 5\' 2"  (1.575 m) 71.7 kg   Physical Exam  Constitutional: She is oriented to person, place, and time. She appears well-developed and well-nourished. No distress.  HENT:  Head: Normocephalic  and atraumatic.  Respiratory: Effort normal.  GI: Soft. She exhibits no distension and no mass. There is no abdominal tenderness. There is no rebound and no guarding.  Neurological: She is alert and oriented to person, place, and time.  Skin: Skin is warm and dry. She is not diaphoretic.  Psychiatric: She has a normal mood and affect. Her behavior is normal. Judgment and thought content normal.  Blind swabs collected by RN.  Results for orders placed or performed during the hospital encounter of 02/09/20 (from the past 24 hour(s))  Pregnancy, urine POC     Status: Abnormal   Collection Time: 02/09/20 10:13 AM  Result Value Ref Range   Preg Test, Ur POSITIVE (A) NEGATIVE  Wet prep, genital     Status: None   Collection Time: 02/09/20 10:41 AM   Specimen: PATH Cytology Cervicovaginal Ancillary Only  Result Value Ref Range   Yeast Wet Prep HPF POC NONE SEEN NONE SEEN   Trich, Wet Prep NONE SEEN NONE SEEN   Clue Cells Wet Prep HPF POC NONE SEEN NONE SEEN   WBC, Wet Prep HPF POC NONE SEEN NONE SEEN   Sperm NONE SEEN   CBC     Status: None   Collection Time: 02/09/20 10:53 AM  Result Value Ref Range   WBC 6.6 4.0 - 10.5 K/uL   RBC 4.40 3.87 - 5.11 MIL/uL   Hemoglobin 12.7 12.0 - 15.0 g/dL   HCT 02/11/20 02/11/20 - 46.2 %   MCV 88.6 80.0 - 100.0 fL   MCH 28.9 26.0 - 34.0 pg   MCHC 32.6 30.0 - 36.0 g/dL   RDW 70.3 50.0 - 93.8 %   Platelets 200 150 - 400 K/uL   nRBC 0.0 0.0 - 0.2 %  Comprehensive metabolic panel     Status: Abnormal   Collection Time: 02/09/20 10:53 AM  Result Value Ref Range   Sodium 138 135 - 145 mmol/L   Potassium 3.6 3.5 - 5.1 mmol/L   Chloride 106 98 - 111 mmol/L   CO2 22 22 - 32 mmol/L   Glucose, Bld 120 (H) 70 - 99 mg/dL   BUN 5 (L) 6 - 20 mg/dL   Creatinine, Ser 99.3 0.44 - 1.00 mg/dL   Calcium 8.5 (L) 8.9 - 10.3 mg/dL   Total Protein 6.4 (L) 6.5 - 8.1 g/dL   Albumin 3.8 3.5 - 5.0 g/dL   AST 17 15 - 41 U/L   ALT 18 0 - 44 U/L   Alkaline Phosphatase 44 38 -  126 U/L   Total Bilirubin 0.4 0.3 - 1.2 mg/dL   GFR calc non Af Amer >60 >60 mL/min   GFR calc Af Amer >60 >60 mL/min   Anion gap 10 5 - 15  hCG, quantitative, pregnancy     Status: Abnormal   Collection Time: 02/09/20 10:53 AM  Result Value Ref Range   hCG, Beta Chain, Quant, S 355 (H) <5 mIU/mL  ABO/Rh     Status: None   Collection Time: 02/09/20 10:53 AM  Result Value Ref Range   ABO/RH(D) O POS    No rh immune globuloin      NOT A RH IMMUNE GLOBULIN CANDIDATE, PT RH POSITIVE Performed at [facility], [address], [place], [place] [address]    US OB LESS THAN 14 WEEKS WITH OB TRANSVAGINAL  Result Date: 02/09/2020 CLINICAL DATA:  Abdominal pain in first trimester of pregnancy; LMP 01/02/2020; quantitative beta HCG = 355 today EXAM: OBSTETRIC <14 WK [place] AND TRANSVAGINAL OB US TECHNIQUE: Both transabdominal and transvaginal ultrasound examinations were performed for complete evaluation of the gestation as well as the maternal uterus, adnexal regions, and pelvic cul-de-sac. Transvaginal technique was performed to assess early pregnancy. COMPARISON:  None FINDINGS: Intrauterine gestational sac: Present, single, tiny Yolk sac:  Not visualized Embryo:  Not visualized Cardiac Activity: N/A Heart Rate: N/A  bpm MSD: 1.8 mm    w   d  ** below chart range Subchorionic hemorrhage:  None visualized. Maternal uterus/adnexae: Uterus otherwise normal appearance, anteverted. No endometrial fluid. RIGHT ovary normal size and morphology 2.5 x 3.2 x 2.6 cm. LEFT ovary normal size and morphology 2.3 x 1.7 x 2.4 cm. Trace free pelvic fluid potentially physiologic. No adnexal masses. IMPRESSION: Tiny gestational sac seen within the uterus. No fetal pole identified to establish viability; may consider follow-up ultrasound in 14 days if clinically indicated to establish viability. Remainder of exam unremarkable. Electronically Signed   By: Ulyses Southward M.D.   On: 02/09/2020 12:31    MAU Course   Procedures  MDM -r/o ectopic -UA: pending at time of discharge -CBC: WNL -CMP: WNL -[place]: tiny gestational sac seen, pregnancy of unknown location -hCG: 355 -ABO: O Positive -WetPrep: WNL -GC/CT collected -pt discharged to home in stable condition  Orders Placed This Encounter  Procedures  . Wet prep, genital    Standing Status:   Standing    Number of Occurrences:   1  . US OB LESS THAN 14 WEEKS WITH OB TRANSVAGINAL    Standing Status:   Standing    Number of Occurrences:   1    Order Specific Question:   Symptom/Reason for Exam    Answer:   Abdominal pain in pregnancy [[id number]]  . CBC    Standing Status:   Standing    Number of Occurrences:   1  . Comprehensive metabolic panel    Standing Status:   Standing    Number of Occurrences:   1  . hCG, quantitative, pregnancy    Standing Status:   Standing    Number of Occurrences:   1  . Urinalysis, Routine w reflex microscopic    Standing Status:   Standing    Number of Occurrences:   1  . Pregnancy, urine POC    Standing Status:   Standing    Number of Occurrences:   1  . ABO/Rh    Standing Status:   Standing    Number of Occurrences:   1  . Discharge patient    Order Specific Question:   Discharge disposition    Answer:   01-Home or Self Care [1]    Order Specific Question:   Discharge patient date    Answer:  02/09/2020   No orders of the defined types were placed in this encounter.  Assessment and Plan   1. Pregnancy of unknown anatomic location   2. Abdominal pain in pregnancy   3. Blood type, Rh positive    Allergies as of 02/09/2020   No Known Allergies     Medication List    STOP taking these medications   levonorgestrel-ethinyl estradiol 0.1-20 MG-MCG tablet Commonly known as: Aviane     TAKE these medications   citalopram 20 MG tablet Commonly known as: CeleXA Take 1 tablet (20 mg total) by mouth daily.   lisdexamfetamine 50 MG capsule Commonly known as: Vyvanse Take 1 capsule (50 mg  total) by mouth every morning.   lisdexamfetamine 50 MG capsule Commonly known as: Vyvanse Take 1 capsule (50 mg total) by mouth daily. TAKE 1 CAPSULE IN THE MORNING   lisdexamfetamine 50 MG capsule Commonly known as: Vyvanse Take 1 capsule (50 mg total) by mouth daily.   prenatal multivitamin Tabs tablet Take 1 tablet by mouth daily at 12 noon.       -will call with culture results, if positive -discussed appropriate nutrition and hydration in pregnancy and ways to relieve dizziness when standing at work for extended periods, including frequent rest breaks with elevating legs and use of compression socks/stockings -safe meds in pregnancy list given -discussed early pregnancy vs. Ectopic vs. SAB -strict ectopic precautions discussed -return MAU precautions given -pt discharged to home in stable condition   Elmyra Ricks E Linsie Lupo 02/09/2020, 1:01 PM

## 2020-02-10 LAB — GC/CHLAMYDIA PROBE AMP (~~LOC~~) NOT AT ARMC
Chlamydia: NEGATIVE
Comment: NEGATIVE
Comment: NORMAL
Neisseria Gonorrhea: NEGATIVE

## 2020-02-11 ENCOUNTER — Other Ambulatory Visit: Payer: Self-pay

## 2020-02-11 ENCOUNTER — Inpatient Hospital Stay (HOSPITAL_COMMUNITY)
Admission: AD | Admit: 2020-02-11 | Discharge: 2020-02-11 | Disposition: A | Discharge status: Home / Self Care | Payer: Medicaid Other | Attending: Obstetrics and Gynecology | Admitting: Obstetrics and Gynecology

## 2020-02-11 DIAGNOSIS — O0281 Inappropriate change in quantitative human chorionic gonadotropin (hCG) in early pregnancy: Secondary | ICD-10-CM

## 2020-02-11 DIAGNOSIS — Z3A01 Less than 8 weeks gestation of pregnancy: Secondary | ICD-10-CM

## 2020-02-11 DIAGNOSIS — Z3401 Encounter for supervision of normal first pregnancy, first trimester: Secondary | ICD-10-CM | POA: Insufficient documentation

## 2020-02-11 DIAGNOSIS — E349 Endocrine disorder, unspecified: Secondary | ICD-10-CM

## 2020-02-11 LAB — HCG, QUANTITATIVE, PREGNANCY: hCG, Beta Chain, Quant, S: 837 m[IU]/mL — ABNORMAL HIGH (ref <5)

## 2020-02-11 NOTE — Discharge Instructions (Signed)

## 2020-02-11 NOTE — MAU Provider Note (Signed)
History   Chief Complaint:  follow up  Kayla Church is  19 y.o. G1P0 Patient's last menstrual period was 01/02/2020 (exact date).. Patient is here for follow up of quantitative HCG and ongoing surveillance of pregnancy status. She is [redacted]w[redacted]d weeks gestation  by LMP.    Since her last visit, the patient is without new complaint. The patient reports bleeding as  none now.  She denies any pain.  General ROS:  negative  Her previous Quantitative HCG values are:  Results for Kayla, Church (MRN [id number]) as of 02/11/2020 11:34  Ref. Range 02/09/2020 10:53  HCG, Beta Chain, Quant, S Latest Ref Range: <5 mIU/mL 355 (H)   Physical Exam   Blood pressure 113/68, pulse 84, temperature 98.3 F (36.8 C), temperature source Oral, resp. rate 16, last menstrual period 01/02/2020, SpO2 100 %.  Focused Gynecological Exam: examination not indicated  Labs: Results for orders placed or performed during the hospital encounter of 02/11/20 (from the past 24 hour(s))  hCG, quantitative, pregnancy   Collection Time: 02/11/20 10:18 AM  Result Value Ref Range   hCG, Beta Chain, Quant, S 837 (H) <5 mIU/mL    Assessment:   1. Elevated serum hCG    Appropriate rise in HCG over 48hrs  Plan: -Discharge home in stable condition -First trimester precautions discussed -Patient advised to follow-up with [facility] in 2 weeks for a repeat u/s -Patient may return to MAU as needed or if her condition were to change or worsen  Rolm Bookbinder, CNM 02/11/2020, 11:33 AM

## 2020-02-11 NOTE — MAU Note (Addendum)
Kayla Church is a 19 y.o. at [redacted]w[redacted]d here in MAU reporting:  For follow up blood work related to previous visit in MAU on 02/09/20. Denies pain or bleeding.  Vitals:   02/11/20 0950  BP: 113/68  Pulse: 84  Resp: 16  Temp: 98.3 F (36.8 C)  SpO2: 100%      Phlebotomy called for lab draw.

## 2020-02-14 ENCOUNTER — Encounter: Payer: Medicaid Other | Admitting: Adult Health

## 2020-02-26 ENCOUNTER — Encounter (HOSPITAL_COMMUNITY): Payer: Self-pay

## 2020-02-26 ENCOUNTER — Other Ambulatory Visit: Payer: Self-pay

## 2020-02-26 ENCOUNTER — Emergency Department (HOSPITAL_COMMUNITY)
Admission: EM | Admit: 2020-02-26 | Discharge: 2020-02-26 | Disposition: A | Discharge status: Home / Self Care | Payer: Medicaid Other | Attending: Emergency Medicine | Admitting: Emergency Medicine

## 2020-02-26 DIAGNOSIS — F17210 Nicotine dependence, cigarettes, uncomplicated: Secondary | ICD-10-CM | POA: Diagnosis not present

## 2020-02-26 DIAGNOSIS — S91114A Laceration without foreign body of right lesser toe(s) without damage to nail, initial encounter: Secondary | ICD-10-CM | POA: Diagnosis not present

## 2020-02-26 DIAGNOSIS — Y9389 Activity, other specified: Secondary | ICD-10-CM | POA: Diagnosis not present

## 2020-02-26 DIAGNOSIS — S91111A Laceration without foreign body of right great toe without damage to nail, initial encounter: Secondary | ICD-10-CM | POA: Insufficient documentation

## 2020-02-26 DIAGNOSIS — W268XXA Contact with other sharp object(s), not elsewhere classified, initial encounter: Secondary | ICD-10-CM | POA: Diagnosis not present

## 2020-02-26 DIAGNOSIS — Z79899 Other long term (current) drug therapy: Secondary | ICD-10-CM | POA: Diagnosis not present

## 2020-02-26 DIAGNOSIS — S91119A Laceration without foreign body of unspecified toe without damage to nail, initial encounter: Secondary | ICD-10-CM

## 2020-02-26 DIAGNOSIS — Y92828 Other wilderness area as the place of occurrence of the external cause: Secondary | ICD-10-CM | POA: Diagnosis not present

## 2020-02-26 DIAGNOSIS — Y999 Unspecified external cause status: Secondary | ICD-10-CM | POA: Insufficient documentation

## 2020-02-26 MED ORDER — CEPHALEXIN 500 MG PO CAPS
500.0000 mg | ORAL_CAPSULE | Freq: Four times a day (QID) | ORAL | 0 refills | Status: DC
Start: 2020-02-26 — End: 2020-03-10

## 2020-02-26 MED ORDER — LIDOCAINE HCL (PF) 2 % IJ SOLN
5.0000 mL | Freq: Once | INTRAMUSCULAR | Status: AC
  Administered 2020-02-26: 5 mL

## 2020-02-26 MED ORDER — LIDOCAINE HCL (PF) 2 % IJ SOLN
INTRAMUSCULAR | Status: AC
  Filled 2020-02-26: qty 10

## 2020-02-26 NOTE — ED Triage Notes (Signed)
Patient c/o laceration to each toe on right foot. Per patient cut toes on metal piece while trying to get back onto make shift boat. Patient unsure of last tetanus vaccination. Bleeding controled. Foot dressed with gauze and kerlix in triage. Patient is two months pregnant.

## 2020-02-26 NOTE — Discharge Instructions (Addendum)
Have your sutures removed in 10 days.  Keep your wound clean and dry,  Until a good scab forms - you may then wash gently twice daily with mild soap and water, but dry completely after.  Get rechecked for any sign of infection (redness,  Swelling,  Increased pain or drainage of purulent fluid).   Wear the post op shoe to protect your toes.   You have prescribed an antibiotic - given this was [place], you need this to help prevent infection.  This medicine is safe in pregnancy.

## 2020-02-27 ENCOUNTER — Ambulatory Visit (INDEPENDENT_AMBULATORY_CARE_PROVIDER_SITE_OTHER): Payer: Medicaid Other | Admitting: *Deleted

## 2020-02-27 ENCOUNTER — Ambulatory Visit
Admission: RE | Admit: 2020-02-27 | Discharge: 2020-02-27 | Disposition: A | Discharge status: Home / Self Care | Payer: Medicaid Other | Source: Ambulatory Visit

## 2020-02-27 DIAGNOSIS — Z3A01 Less than 8 weeks gestation of pregnancy: Secondary | ICD-10-CM | POA: Diagnosis not present

## 2020-02-27 DIAGNOSIS — E349 Endocrine disorder, unspecified: Secondary | ICD-10-CM | POA: Diagnosis not present

## 2020-02-27 DIAGNOSIS — O3680X0 Pregnancy with inconclusive fetal viability, not applicable or unspecified: Secondary | ICD-10-CM | POA: Diagnosis not present

## 2020-02-27 DIAGNOSIS — Z712 Person consulting for explanation of examination or test findings: Secondary | ICD-10-CM

## 2020-02-27 NOTE — Progress Notes (Signed)
[place] results reviewed with Dr. Adrian Blackwater. She was informed of results showing normal IUP with embryo and cardiac activity. Pictures which were provided by radiology were given to pt. She plans prenatal care @ [facility]. She was advised to go to MAU if she develops heavy vaginal bleeding or abdominal/pelvic pain. Pt voiced understanding of all information and instructions given.

## 2020-02-28 NOTE — Progress Notes (Signed)
Chart reviewed - agree with CMA/RN documentation.  ° °

## 2020-02-29 ENCOUNTER — Telehealth: Payer: Self-pay | Admitting: Nurse Practitioner

## 2020-02-29 NOTE — ED Provider Notes (Signed)
[facility] EMERGENCY DEPARTMENT Provider Note   CSN: [id number] Arrival date & time: 02/26/20  1741     History Chief Complaint  Patient presents with  . Laceration    Kayla Church is a 19 y.o. female presenting with lacerations to 3 of her toes on her right foot, incurred [place], catching the foot against a sharp metal edge.  She reports immediate pain of the toes and moderate bleeding but now well controlled after applying dressing application.  She is current with her tetanus status.  She is newly pregnant, anticipating establishing prenatal care this week in [place].    HPI     Past Medical History:  Diagnosis Date  . ADD (attention deficit disorder)    with inadequate control  . History of URI (upper respiratory infection)   . Left knee injury 4/12   bruise   . Sinus arrhythmia     Patient Active Problem List   Diagnosis Date Noted  . Depression, major, single episode, complete remission ([place]) 05/31/2019  . Drug-induced insomnia ([place]) 04/27/2018  . 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 No pertinent surgical history.   OB History    Gravida  1   Para      Term      Preterm      AB      Living        SAB      TAB      Ectopic      Multiple      Live Births              Family History  Problem Relation Age of Onset  . Bipolar disorder Father     Social History   Tobacco Use  . Smoking status: Current Every Day Smoker    Packs/day: 0.25    Types: Cigarettes  . Smokeless tobacco: Never Used  . Tobacco comment: pt reports she has had a total of 6 cigarettes since 02/07/20  Substance Use Topics  . Alcohol use: No  . Drug use: Yes    Types: Marijuana    Comment: pt reports she stopped 5/18    Home Medications Prior to Admission medications   Medication Sig Start Date End Date Taking? Authorizing Provider  cephALEXin (KEFLEX) 500  MG capsule Take 1 capsule (500 mg total) by mouth 4 (four) times daily. 02/26/20   Evalee Jefferson, PA-C  citalopram (CELEXA) 20 MG tablet Take 1 tablet (20 mg total) by mouth daily. 05/31/19   Hassell Done, Mary-Margaret, FNP  lisdexamfetamine (VYVANSE) 50 MG capsule Take 1 capsule (50 mg total) by mouth daily. 07/29/19 08/28/19  Chevis Pretty, FNP  Prenatal Vit-Fe Fumarate-FA (PRENATAL MULTIVITAMIN) TABS tablet Take 1 tablet by mouth daily at 12 noon.    [provider]    Allergies    Patient has no known allergies.  Review of Systems   Review of Systems  Constitutional: Negative for chills and fever.  Musculoskeletal: Negative for arthralgias.  Skin: Positive for wound.  Neurological: Negative for weakness and numbness.  All other systems reviewed and are negative.   Physical Exam Updated Vital Signs BP 133/85   Pulse 96   Temp 97.8 F (36.6 C) (Oral)   Resp 18   Ht 5\' 2"  (1.575 m)   Wt 68 kg   LMP 01/02/2020 (Exact Date)   SpO2 100%   BMI 27.44 kg/m  Physical Exam Constitutional:      Appearance: She is well-developed.  HENT:     Head: Normocephalic.  Cardiovascular:     Rate and Rhythm: Normal rate.  Pulmonary:     Effort: Pulmonary effort is normal.  Musculoskeletal:        General: No swelling. Normal range of motion.  Skin:    Findings: Laceration present.     Comments: Right foot:  1 cm linear laceration lateral great toe next to nail plate which is intact, superficial.  2 cm deep flap laceration of the 4th toe, through the subcutaneous layer, but no muscle or bone appreciated.  1 cm more superficial flap laceration of the 3rd lateral toe. All hemostatic. Sensation intact.  Can flex/ext toes without deficit.   Neurological:     Mental Status: She is alert and oriented to person, place, and time.     Sensory: No sensory deficit.     ED Results / Procedures / Treatments   Labs (all labs ordered are listed, but only abnormal results are displayed) Labs  Reviewed - No data to display  EKG None  Radiology   Procedures Procedures (including critical care time)  LACERATION REPAIR #1 Great toe Performed by: Burgess Amor Authorized by: Burgess Amor Consent: Verbal consent obtained. Risks and benefits: risks, benefits and alternatives were discussed Consent given by: patient Patient identity confirmed: provided demographic data Prepped and Draped in normal sterile fashion Wound explored  Laceration Location: right great toe lateral to nail plate which is intact  Laceration Length: 1cm  No Foreign Bodies seen or palpated  Anesthesia: local infiltration  Local anesthetic: lidocaine 2% without epinephrine  Anesthetic total: 3 ml  Irrigation method: syringe Amount of cleaning: copious using wound cleaning spray - Safe Cleanse  Skin closure: ethilon 4-0  Number of sutures: 3  Technique: simple interupted  Patient tolerance: Patient tolerated the procedure well with no immediate complications.  LACERATION REPAIR #2 right 4th toe, large flap laceration  Complicated wound. Performed by: Burgess Amor Authorized by: Burgess Amor Consent: Verbal consent obtained. Risks and benefits: risks, benefits and alternatives were discussed Consent given by: patient Patient identity confirmed: provided demographic data Prepped and Draped in normal sterile fashion Wound explored  Laceration Location: right 4th toe  Laceration Length: 2 cm  No Foreign Bodies seen or palpated  Anesthesia: digital block  Local anesthetic: lidocaine 2% without epinephrine  Anesthetic total: 2 ml  Irrigation method: syringe Amount of cleaning: copious using wound clean spray Skin closure: ethilon 4-0  Number of sutures: 5.  The plantar aspect of the wound was very difficult to reach using suture technique, this edge was  Approximated using sterile strips.   Technique: simple interrupted  Patient tolerance: Patient tolerated the procedure well with no  immediate complications.  In addition, there was a smaller, superficial flap laceration along the lateral edge of the right 3rd toe, not amenable to sutures - steri strips placed.     Medications Ordered in ED Medications  lidocaine HCl (PF) (XYLOCAINE) 2 % injection 5 mL (5 mLs Other Given 02/26/20 1949)    ED Course  I have reviewed the triage vital signs and the nursing notes.  Pertinent labs & imaging results that were available during my care of the patient were reviewed by me and considered in my medical decision making (see chart for details).    MDM Rules/Calculators/A&P  Wound care instructions given.  Pt advised to have sutures removed in 10 days,  Return here or see pcp sooner for any signs of infection including redness, swelling, worse pain or drainage of pus.  She was placed on keflex given this wound was incurred while lake swimming.  She is currently pregnant so cipro contraindicated.     Final Clinical Impression(s) / ED Diagnoses Final diagnoses:  Laceration of toe of right foot without foreign body present or damage to nail, unspecified toe, initial encounter    Rx / DC Orders ED Discharge Orders         Ordered    cephALEXin (KEFLEX) 500 MG capsule  4 times daily     02/26/20 2121           Burgess Amor, PA-C 02/29/20 0032    Bethann Berkshire, MD 02/29/20 0710

## 2020-02-29 NOTE — Telephone Encounter (Signed)
Patient aware ok if they fall off just do not pull off and we will see her at her appointment

## 2020-03-03 DIAGNOSIS — Z5189 Encounter for other specified aftercare: Secondary | ICD-10-CM | POA: Diagnosis not present

## 2020-03-06 ENCOUNTER — Ambulatory Visit: Payer: Medicaid Other | Admitting: Nurse Practitioner

## 2020-03-08 ENCOUNTER — Ambulatory Visit (INDEPENDENT_AMBULATORY_CARE_PROVIDER_SITE_OTHER): Payer: Medicaid Other | Admitting: Nurse Practitioner

## 2020-03-08 ENCOUNTER — Other Ambulatory Visit: Payer: Self-pay

## 2020-03-08 ENCOUNTER — Encounter: Payer: Self-pay | Admitting: Nurse Practitioner

## 2020-03-08 VITALS — BP 88/54 | HR 68 | Temp 96.8°F | Resp 20 | Ht 62.0 in | Wt 162.0 lb

## 2020-03-08 DIAGNOSIS — F325 Major depressive disorder, single episode, in full remission: Secondary | ICD-10-CM | POA: Diagnosis not present

## 2020-03-08 DIAGNOSIS — Z4802 Encounter for removal of sutures: Secondary | ICD-10-CM

## 2020-03-08 NOTE — Assessment & Plan Note (Signed)
Patient is a 19 year old female who presents to clinic today for suture removal of laceration on the great toe and fifth metatarsal right foot.  Patient sustained injury [place]  Foot was caught in a sharp metal edge, this happened February 26, 2020.  Patient is not reporting any pain today or sign and symptom of infection.  Provided education and printed handout given.  Sutures removed, patient tolerated procedure well.  No signs or symptoms of infection on foot.  No swelling but mild tenderness when touched.  Advised patient to receive tetanus shot, patient verbalized tetanus shot is up-to-date.  Last tetanus shot recorded 2013, patient notified. Patient is to follow-up with worsening or unresolved symptoms or any signs of infection.

## 2020-03-08 NOTE — Progress Notes (Signed)
Established Patient Office Visit  Subjective:  Patient ID: Kayla Church, female    DOB: 08/26/2001  Age: 19 y.o. MRN: [id number]  CC:  Chief Complaint  Patient presents with  . Suture / Staple Removal    AP placed 02/26/20 - right foot - toe     HPI Kayla Church presents for suture removal of laceration on the great toe, and fifth metatarsal right foot.  Patient sustained injury while [place]  Foot was caught  in a sharp metal edge. Patient is not reporting any pain today, no signs and symptoms of infection.   Concerning patients Depression: Patient was diagnosed in 2020.  Patient is tolerating current medication well without side effects.  Compliance with treatment has been good, including taking medication as directed.  Patient denies current suicidal and homicidal plan or intent.  Family history is significant for depression.  Current treatment includes Celexa 20 mg tablet daily.       Past Medical History:  Diagnosis Date  . ADD (attention deficit disorder)    with inadequate control  . History of URI (upper respiratory infection)   . Left knee injury 4/12   bruise   . Sinus arrhythmia       Family History  Problem Relation Age of Onset  . Bipolar disorder Father     Social History   Socioeconomic History  . Marital status: Single    Spouse name: Not on file  . Number of children: Not on file  . Years of education: Not on file  . Highest education level: Not on file  Occupational History  . Not on file  Tobacco Use  . Smoking status: Current Every Day Smoker    Packs/day: 0.25    Types: Cigarettes  . Smokeless tobacco: Never Used  . Tobacco comment: pt reports she has had a total of 6 cigarettes since 02/07/20  Vaping Use  . Vaping Use: Never used  Substance and Sexual Activity  . Alcohol use: No  . Drug use: Yes    Types: Marijuana    Comment: pt reports she stopped 5/18  . Sexual activity: Yes    Birth  control/protection: None  Other Topics Concern  . Not on file  Social History Narrative  . Not on file   Social Determinants of Health   Financial Resource Strain:   . Difficulty of Paying Living Expenses:   Food Insecurity:   . Worried About Programme researcher, broadcasting/film/video in the Last Year:   . Barista in the Last Year:   Transportation Needs:   . Freight forwarder (Medical):   Marland Kitchen Lack of Transportation (Non-Medical):   Physical Activity:   . Days of Exercise per Week:   . Minutes of Exercise per Session:   Stress:   . Feeling of Stress :   Social Connections:   . Frequency of Communication with Friends and Family:   . Frequency of Social Gatherings with Friends and Family:   . Attends Religious Services:   . Active Member of Clubs or Organizations:   . Attends Banker Meetings:   Marland Kitchen Marital Status:   Intimate Partner Violence:   . Fear of Current or Ex-Partner:   . Emotionally Abused:   Marland Kitchen Physically Abused:   . Sexually Abused:     Outpatient Medications Prior to Visit  Medication Sig Dispense Refill  . cephALEXin (KEFLEX) 500 MG capsule Take 1  capsule (500 mg total) by mouth 4 (four) times daily. 40 capsule 0  . citalopram (CELEXA) 20 MG tablet Take 1 tablet (20 mg total) by mouth daily. 30 tablet 5  . lisdexamfetamine (VYVANSE) 50 MG capsule Take 1 capsule (50 mg total) by mouth daily. 30 capsule 0  . Prenatal Vit-Fe Fumarate-FA (PRENATAL MULTIVITAMIN) TABS tablet Take 1 tablet by mouth daily at 12 noon.     No facility-administered medications prior to visit.    No Known Allergies  ROS Review of Systems  Constitutional: Negative.   HENT: Negative.   Eyes: Negative.   Respiratory: Negative.   Cardiovascular: Negative.   Genitourinary: Negative.   Musculoskeletal: Positive for joint swelling.       Swollen Fifth metatarsal of The right foot   Skin: Positive for color change.  Psychiatric/Behavioral: Negative for suicidal ideas. The patient is  not nervous/anxious.       Objective:    Physical Exam Constitutional:      Appearance: Normal appearance.  HENT:     Head: Normocephalic.  Cardiovascular:     Heart sounds: Normal heart sounds.  Pulmonary:     Breath sounds: Normal breath sounds.  Abdominal:     General: Bowel sounds are normal.  Musculoskeletal:        General: Swelling and tenderness present.  Skin:    General: Skin is warm.     Findings: Erythema present.     Comments: Sutures removed on fifth metatarsal of the right foot  Neurological:     Mental Status: She is alert and oriented to person, place, and time.  Psychiatric:        Mood and Affect: Mood normal.        Behavior: Behavior normal.     BP (!) 88/54   Pulse 68   Temp (!) 96.8 F (36 C)   Resp 20   Ht 5\' 2"  (1.575 m)   Wt 162 lb (73.5 kg)   LMP 01/02/2020 (Exact Date)   SpO2 98%   BMI 29.63 kg/m  Wt Readings from Last 3 Encounters:  02/26/20 150 lb (68 kg) (82 %, Z= 0.93)*  02/09/20 158 lb (71.7 kg) (88 %, Z= 1.16)*  05/31/19 159 lb (72.1 kg) (89 %, Z= 1.24)*   * Growth percentiles are based on CDC (Girls, 2-20 Years) data.     Office Visit from 03/08/2020 in 03/10/2020 Family Medicine  PHQ-2 Total Score 0      Health Maintenance Due  Topic Date Due  . COVID-19 Vaccine (1) Never done    There are no preventive care reminders to display for this patient.  No results found for: TSH Lab Results  Component Value Date   WBC 6.6 02/09/2020   HGB 12.7 02/09/2020   HCT 39.0 02/09/2020   MCV 88.6 02/09/2020   PLT 200 02/09/2020   Lab Results  Component Value Date   NA 138 02/09/2020   K 3.6 02/09/2020   CO2 22 02/09/2020   GLUCOSE 120 (H) 02/09/2020   BUN 5 (L) 02/09/2020   CREATININE 0.72 02/09/2020   BILITOT 0.4 02/09/2020   ALKPHOS 44 02/09/2020   AST 17 02/09/2020   ALT 18 02/09/2020   PROT 6.4 (L) 02/09/2020   ALBUMIN 3.8 02/09/2020   CALCIUM 8.5 (L) 02/09/2020   ANIONGAP 10 02/09/2020        Assessment & Plan:   Problem List Items Addressed This Visit      Other   Depression, major,  single episode, complete remission ([place]) - Primary    Well controlled on current medication no changes to medication dose.       Encounter for removal of sutures    Patient is a 19 year old female who presents to clinic today for suture removal of laceration on the great toe and fifth metatarsal right foot.  Patient sustained [place]  Foot was caught in a sharp metal edge, this happened February 26, 2020.  Patient is not reporting any pain today or sign and symptom of infection.  Provided education and printed handout given.  Sutures removed, patient tolerated procedure well.  No signs or symptoms of infection on foot.  No swelling but mild tenderness when touched.  Advised patient to receive tetanus shot, patient verbalized tetanus shot is up-to-date.  Last tetanus shot recorded 2013, patient notified. Patient is to follow-up with worsening or unresolved symptoms or any signs of infection.            Follow-up: Return if symptoms worsen or fail to improve.    Ivy Lynn, NP

## 2020-03-08 NOTE — Assessment & Plan Note (Signed)
Well-controlled on current medication no changes to medication dose. °

## 2020-03-08 NOTE — Patient Instructions (Addendum)
Depression, major, single episode, complete remission ([facility]) Well controlled on current medication no changes to medication dose.   Encounter for removal of sutures Patient is a 19 year old female who presents to clinic today for suture removal of laceration on the great toe and fifth metatarsal right foot.  Patient sustained injury [place]  Foot was caught in a sharp metal edge, this happened February 26, 2020.  Patient is not reporting any pain today or sign and symptom of infection.  Provided education and printed handout given.  Sutures removed, patient tolerated procedure well.  No signs or symptoms of infection on foot.  No swelling but mild tenderness when touched.  Advised patient to receive tetanus shot, patient verbalized tetanus shot is up-to-date.  Last tetanus shot recorded 2013, patient notified. Patient is to follow-up with worsening or unresolved symptoms or any signs of infection.     Wound Infection A wound infection happens when germs start to grow in a wound. Germs that cause wound infections are most often bacteria. Other types of infections can occur as well. An infection can cause the wound to break open. Wound infections need treatment. If a wound infection is not treated, problems can happen. What are the causes?  Most often caused by germs (bacteria) that grow in a wound.  Other germs, such as yeast and funguses, can also cause wound infections. What increases the risk?  Having a weak body defense system (immune system).  Having diabetes.  Taking certain medicines (steroids) for a long time.  Smoking.  Being an older person.  Being overweight.  Taking certain medicines for cancer treatment. What are the signs or symptoms?  Having more redness, swelling, or pain at the wound site.  Having more blood or fluid at the wound site.  A bad smell coming from a wound or bandage (dressing).  Having a fever.  Feeling very  tired.  Having warmth at or around the wound.  Having pus at the wound site. How is this treated?  This condition is most often treated with an antibiotic medicine. ? The infection should improve 24-48 hours after you start antibiotics. ? After 24-48 hours, redness around the wound should stop spreading. The wound should also be less painful. Follow these instructions at home: Medicines  Take or apply over-the-counter and prescription medicines only as told by your doctor.  If you were prescribed an antibiotic medicine, take or apply it as told by your doctor. Do not stop using the antibiotic even if you start to feel better. Wound care   Clean the wound each day, or as told by your doctor. ? Wash the wound with mild soap and water. ? Rinse the wound with water to remove all soap. ? Pat the wound dry with a clean towel. Do not rub it.  Follow instructions from your doctor about how to take care of your wound. Make sure you: ? Wash your hands with soap and water before and after you change your bandage. If you cannot use soap and water, use hand sanitizer. ? Change your bandage as told by your doctor. ? Leave stitches (sutures), skin glue, or skin tape (adhesive) strips in place if your wound has been closed. They may need to stay in place for 2 weeks or longer. If tape strips get loose and curl up, you may trim the loose edges. Do not remove tape strips completely unless your doctor says it is okay. Some wounds are  left open to heal on their own.  Check your wound every day for signs of infection. Watch for: ? More redness, swelling, or pain. ? More fluid or blood. ? Warmth. ? Pus or a bad smell. General instructions  Keep the bandage dry until your doctor says it can be removed.  Do not take baths, swim, or use a hot tub until your doctor approves. Ask your doctor if you may take showers. You may only be allowed to take sponge baths.  Raise (elevate) the injured area above  the level of your heart while you are sitting or lying down.  Do not scratch or pick at the wound.  Keep all follow-up visits as told by your doctor. This is important. Contact a doctor if:  Medicine does not help your pain.  You have more redness, swelling, or pain around your wound.  You have more fluid or blood coming from your wound.  Your wound feels warm to the touch.  You have pus coming from your wound.  You notice a bad smell coming from your wound or your bandage.  Your wound that was closed breaks open. Get help right away if:  You have a red streak going away from your wound.  You have a fever. Summary  A wound infection happens when germs start to grow in a wound.  This condition is usually treated with an antibiotic medicine.  Follow instructions from your doctor about how to take care of your wound.  Contact a doctor if your wound infection does not start to get better in 24-48 hours, or your symptoms get worse.  Keep all follow-up visits as told by your doctor. This is important. This information is not intended to replace advice given to you by your health care provider. Make sure you discuss any questions you have with your health care provider. Document Revised: 04/20/2018 Document Reviewed: 04/20/2018 Elsevier Patient Education  [place].

## 2020-03-09 ENCOUNTER — Ambulatory Visit: Payer: Medicaid Other

## 2020-03-10 ENCOUNTER — Emergency Department (HOSPITAL_COMMUNITY)
Admission: EM | Admit: 2020-03-10 | Discharge: 2020-03-10 | Disposition: A | Discharge status: Home / Self Care | Payer: Medicaid Other | Attending: Emergency Medicine | Admitting: Emergency Medicine

## 2020-03-10 ENCOUNTER — Encounter (HOSPITAL_COMMUNITY): Payer: Self-pay

## 2020-03-10 ENCOUNTER — Other Ambulatory Visit: Payer: Self-pay

## 2020-03-10 DIAGNOSIS — R002 Palpitations: Secondary | ICD-10-CM | POA: Diagnosis not present

## 2020-03-10 DIAGNOSIS — O26891 Other specified pregnancy related conditions, first trimester: Secondary | ICD-10-CM | POA: Insufficient documentation

## 2020-03-10 DIAGNOSIS — O99281 Endocrine, nutritional and metabolic diseases complicating pregnancy, first trimester: Secondary | ICD-10-CM | POA: Diagnosis not present

## 2020-03-10 DIAGNOSIS — O99331 Smoking (tobacco) complicating pregnancy, first trimester: Secondary | ICD-10-CM | POA: Diagnosis not present

## 2020-03-10 DIAGNOSIS — O219 Vomiting of pregnancy, unspecified: Secondary | ICD-10-CM | POA: Diagnosis not present

## 2020-03-10 DIAGNOSIS — R0602 Shortness of breath: Secondary | ICD-10-CM | POA: Diagnosis not present

## 2020-03-10 DIAGNOSIS — E871 Hypo-osmolality and hyponatremia: Secondary | ICD-10-CM

## 2020-03-10 DIAGNOSIS — F17210 Nicotine dependence, cigarettes, uncomplicated: Secondary | ICD-10-CM | POA: Insufficient documentation

## 2020-03-10 DIAGNOSIS — Z3A01 Less than 8 weeks gestation of pregnancy: Secondary | ICD-10-CM | POA: Diagnosis not present

## 2020-03-10 DIAGNOSIS — R110 Nausea: Secondary | ICD-10-CM | POA: Diagnosis not present

## 2020-03-10 DIAGNOSIS — R0600 Dyspnea, unspecified: Secondary | ICD-10-CM | POA: Diagnosis present

## 2020-03-10 DIAGNOSIS — Z3491 Encounter for supervision of normal pregnancy, unspecified, first trimester: Secondary | ICD-10-CM

## 2020-03-10 LAB — CBC WITH DIFFERENTIAL/PLATELET
Abs Immature Granulocytes: 0.07 10*3/uL (ref 0.00–0.07)
Basophils Absolute: 0 10*3/uL (ref 0.0–0.1)
Basophils Relative: 0 %
Eosinophils Absolute: 0.2 10*3/uL (ref 0.0–0.5)
Eosinophils Relative: 1 %
HCT: 38.1 % (ref 36.0–46.0)
Hemoglobin: 12.4 g/dL (ref 12.0–15.0)
Immature Granulocytes: 1 %
Lymphocytes Relative: 18 %
Lymphs Abs: 2.6 10*3/uL (ref 0.7–4.0)
MCH: 29 pg (ref 26.0–34.0)
MCHC: 32.5 g/dL (ref 30.0–36.0)
MCV: 89.2 fL (ref 80.0–100.0)
Monocytes Absolute: 0.7 10*3/uL (ref 0.1–1.0)
Monocytes Relative: 5 %
Neutro Abs: 10.4 10*3/uL — ABNORMAL HIGH (ref 1.7–7.7)
Neutrophils Relative %: 75 %
Platelets: 229 10*3/uL (ref 150–400)
RBC: 4.27 MIL/uL (ref 3.87–5.11)
RDW: 12.6 % (ref 11.5–15.5)
WBC: 14 10*3/uL — ABNORMAL HIGH (ref 4.0–10.5)
nRBC: 0 % (ref 0.0–0.2)

## 2020-03-10 LAB — BASIC METABOLIC PANEL
Anion gap: 6 (ref 5–15)
BUN: 6 mg/dL (ref 6–20)
CO2: 23 mmol/L (ref 22–32)
Calcium: 8.6 mg/dL — ABNORMAL LOW (ref 8.9–10.3)
Chloride: 105 mmol/L (ref 98–111)
Creatinine, Ser: 0.64 mg/dL (ref 0.44–1.00)
GFR calc Af Amer: >60 mL/min (ref >60)
GFR calc non Af Amer: >60 mL/min (ref >60)
Glucose, Bld: 93 mg/dL (ref 70–99)
Potassium: 3.7 mmol/L (ref 3.5–5.1)
Sodium: 134 mmol/L — ABNORMAL LOW (ref 135–145)

## 2020-03-10 MED ORDER — DOXYLAMINE-PYRIDOXINE 10-10 MG PO TBEC: 1.0000 | DELAYED_RELEASE_TABLET | Freq: Two times a day (BID) | ORAL | 0 refills | Status: DC | PRN

## 2020-03-10 NOTE — Discharge Instructions (Signed)
Your evaluation here did not show any serious problems.  Return if you have any concerning symptoms.

## 2020-03-10 NOTE — ED Triage Notes (Signed)
Pt states she feels like her heart is thumping hard in her chest, admits to some anxiety recently. Pt states she is approx 8 weeks preg.  Pt denies pain or other complaints

## 2020-03-10 NOTE — ED Provider Notes (Signed)
[facility] EMERGENCY DEPARTMENT Provider Note   CSN: [id number] Arrival date & time: 03/10/20  0011   History Chief Complaint  Patient presents with  . Palpitations    Kayla Church is a 19 y.o. female.  The history is provided by the patient.  Palpitations She has history of attention deficit disorder and is currently [redacted] weeks pregnant who comes in because of episodes of dyspnea and palpitations over the last 2 weeks.  During these episodes, she will feel her heart pounding in her chest and will continue until she falls asleep.  There is no associated chest pain, heaviness, tightness, pressure.  She has some nausea related to pregnancy, but nausea is not any worse during these episodes.  She denies diaphoresis.  There has been no cough.  She denies fever or chills.  She has not done anything to treat the symptoms.  Past Medical History:  Diagnosis Date  . ADD (attention deficit disorder)    with inadequate control  . History of URI (upper respiratory infection)   . Left knee injury 4/12   bruise   . Sinus arrhythmia     Patient Active Problem List   Diagnosis Date Noted  . Encounter for removal of sutures 03/08/2020  . Depression, major, single episode, complete remission ([place]) 05/31/2019  . 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 No pertinent surgical history.   OB History    Gravida  1   Para      Term      Preterm      AB      Living        SAB      TAB      Ectopic      Multiple      Live Births              Family History  Problem Relation Age of Onset  . Bipolar disorder Father     Social History   Tobacco Use  . Smoking status: Current Every Day Smoker    Packs/day: 0.25    Types: Cigarettes  . Smokeless tobacco: Never Used  . Tobacco comment: pt reports she has had a total of 6 cigarettes since 02/07/20  Vaping Use  . Vaping Use:  Never used  Substance Use Topics  . Alcohol use: No  . Drug use: Yes    Types: Marijuana    Comment: pt reports she stopped 5/18    Home Medications Prior to Admission medications   Medication Sig Start Date End Date Taking? Authorizing Provider  Doxylamine-Pyridoxine 10-10 MG TBEC Take 1 tablet by mouth 2 (two) times daily as needed (nausea). 04/02/44   Delora Fuel, MD  Prenatal Vit-Fe Fumarate-FA (PRENATAL MULTIVITAMIN) TABS tablet Take 1 tablet by mouth daily at 12 noon.    [provider]    Allergies    Patient has no known allergies.  Review of Systems   Review of Systems  Cardiovascular: Positive for palpitations.  All other systems reviewed and are negative.   Physical Exam Updated Vital Signs BP 126/75 (BP Location: Left Arm)   Pulse 76   Temp 98.1 F (36.7 C) (Oral)   Resp 15   Ht 5\' 2"  (1.575 m)   Wt 72.6 kg   LMP 01/02/2020 (Exact Date)   SpO2 100%   BMI 29.26 kg/m   Physical Exam Vitals and nursing note reviewed.  19 year old female, resting comfortably and in no acute distress. Vital signs are normal. Oxygen saturation is 100%, which is normal. Head is normocephalic and atraumatic. PERRLA, EOMI. Oropharynx is clear. Neck is nontender and supple without adenopathy or JVD. Back is nontender and there is no CVA tenderness. Lungs are clear without rales, wheezes, or rhonchi. Chest is nontender. Heart has regular rate and rhythm without murmur. Abdomen is soft, flat, nontender without masses or hepatosplenomegaly and peristalsis is normoactive. Extremities have no cyanosis or edema, full range of motion is present. Skin is warm and dry without rash. Neurologic: Mental status is normal, cranial nerves are intact, there are no motor or sensory deficits.  ED Results / Procedures / Treatments   Labs (all labs ordered are listed, but only abnormal results are displayed) Labs Reviewed  CBC WITH DIFFERENTIAL/PLATELET - Abnormal; Notable for the  following components:      Result Value   WBC 14.0 (*)    Neutro Abs 10.4 (*)    All other components within normal limits  BASIC METABOLIC PANEL - Abnormal; Notable for the following components:   Sodium 134 (*)    Calcium 8.6 (*)    All other components within normal limits    EKG EKG Interpretation  Date/Time:  Saturday March 10 2020 00:29:32 EDT Ventricular Rate:  78 PR Interval:    QRS Duration: 94 QT Interval:  356 QTC Calculation: 406 R Axis:   97 Text Interpretation: Sinus rhythm Borderline right axis deviation Otherwise within normal limits No old tracing to compare Confirmed by Dione Booze ([id number]) on 03/10/2020 12:51:44 AM  Procedures Procedures  Medications Ordered in ED Medications - No data to display  ED Course  I have reviewed the triage vital signs and the nursing notes.  Pertinent lab results that were available during my care of the patient were reviewed by me and considered in my medical decision making (see chart for details).  MDM Rules/Calculators/A&P Subjective palpitations.  ECG shows sinus rhythm.  Monitor shows sinus rhythm with sinus arrhythmia.  There are no red flags to suggest serious pathology.  Will check electrolytes to make sure he is not hypokalemic.  Old records are reviewed, and she has no relevant past visits.  Labs show mild leukocytosis which is felt to be secondary to pregnancy.  Mild hyponatremia is present, not felt to be clinically significant.  She has not shown any arrhythmias while in the ED and is felt to be safe for discharge.  She is given a prescription for doxylamine-pyridoxine for nausea of pregnancy.  Return precautions discussed.  Final Clinical Impression(s) / ED Diagnoses Final diagnoses:  Palpitations  First trimester pregnancy  Vomiting or nausea of pregnancy  Hyponatremia    Rx / DC Orders ED Discharge Orders         Ordered    Doxylamine-Pyridoxine 10-10 MG TBEC  2 times daily PRN     Discontinue  Reprint       03/10/20 0222           Dione Booze, MD 03/10/20 [phone]

## 2020-03-10 NOTE — ED Notes (Signed)
Patient's iv removed 22 ga in right [facility]

## 2020-03-23 DIAGNOSIS — Z3201 Encounter for pregnancy test, result positive: Secondary | ICD-10-CM | POA: Diagnosis not present

## 2020-03-23 DIAGNOSIS — N911 Secondary amenorrhea: Secondary | ICD-10-CM | POA: Diagnosis not present

## 2020-04-18 DIAGNOSIS — Z3401 Encounter for supervision of normal first pregnancy, first trimester: Secondary | ICD-10-CM | POA: Diagnosis not present

## 2020-04-18 DIAGNOSIS — O0932 Supervision of pregnancy with insufficient antenatal care, second trimester: Secondary | ICD-10-CM | POA: Diagnosis not present

## 2020-04-18 DIAGNOSIS — Z3482 Encounter for supervision of other normal pregnancy, second trimester: Secondary | ICD-10-CM | POA: Diagnosis not present

## 2020-04-18 DIAGNOSIS — Z3689 Encounter for other specified antenatal screening: Secondary | ICD-10-CM | POA: Diagnosis not present

## 2020-04-18 DIAGNOSIS — Z3A15 15 weeks gestation of pregnancy: Secondary | ICD-10-CM | POA: Diagnosis not present

## 2020-04-18 DIAGNOSIS — Z113 Encounter for screening for infections with a predominantly sexual mode of transmission: Secondary | ICD-10-CM | POA: Diagnosis not present

## 2020-04-18 DIAGNOSIS — O26891 Other specified pregnancy related conditions, first trimester: Secondary | ICD-10-CM | POA: Diagnosis not present

## 2020-04-18 DIAGNOSIS — O99332 Smoking (tobacco) complicating pregnancy, second trimester: Secondary | ICD-10-CM | POA: Diagnosis not present

## 2020-04-18 DIAGNOSIS — O09612 Supervision of young primigravida, second trimester: Secondary | ICD-10-CM | POA: Diagnosis not present

## 2020-04-18 DIAGNOSIS — Z363 Encounter for antenatal screening for malformations: Secondary | ICD-10-CM | POA: Diagnosis not present

## 2020-04-18 LAB — OB RESULTS CONSOLE HIV ANTIBODY (ROUTINE TESTING): HIV: NONREACTIVE

## 2020-04-18 LAB — OB RESULTS CONSOLE GC/CHLAMYDIA
Chlamydia: NEGATIVE
Gonorrhea: NEGATIVE

## 2020-04-18 LAB — OB RESULTS CONSOLE RUBELLA ANTIBODY, IGM: Rubella: IMMUNE

## 2020-04-18 LAB — OB RESULTS CONSOLE ANTIBODY SCREEN: Antibody Screen: NEGATIVE

## 2020-04-18 LAB — OB RESULTS CONSOLE ABO/RH: RH Type: POSITIVE

## 2020-04-18 LAB — OB RESULTS CONSOLE HEPATITIS B SURFACE ANTIGEN: Hepatitis B Surface Ag: NEGATIVE

## 2020-05-18 DIAGNOSIS — O99332 Smoking (tobacco) complicating pregnancy, second trimester: Secondary | ICD-10-CM | POA: Diagnosis not present

## 2020-05-18 DIAGNOSIS — Z3A19 19 weeks gestation of pregnancy: Secondary | ICD-10-CM | POA: Diagnosis not present

## 2020-05-18 DIAGNOSIS — Z363 Encounter for antenatal screening for malformations: Secondary | ICD-10-CM | POA: Diagnosis not present

## 2020-05-18 DIAGNOSIS — Z719 Counseling, unspecified: Secondary | ICD-10-CM | POA: Diagnosis not present

## 2020-05-18 DIAGNOSIS — O0932 Supervision of pregnancy with insufficient antenatal care, second trimester: Secondary | ICD-10-CM | POA: Diagnosis not present

## 2020-05-18 DIAGNOSIS — O09612 Supervision of young primigravida, second trimester: Secondary | ICD-10-CM | POA: Diagnosis not present

## 2020-05-18 DIAGNOSIS — Z7189 Other specified counseling: Secondary | ICD-10-CM | POA: Diagnosis not present

## 2020-06-13 DIAGNOSIS — Z20828 Contact with and (suspected) exposure to other viral communicable diseases: Secondary | ICD-10-CM | POA: Diagnosis not present

## 2020-06-13 DIAGNOSIS — R519 Headache, unspecified: Secondary | ICD-10-CM | POA: Diagnosis not present

## 2020-06-18 DIAGNOSIS — Z3A24 24 weeks gestation of pregnancy: Secondary | ICD-10-CM | POA: Diagnosis not present

## 2020-06-18 DIAGNOSIS — Z7189 Other specified counseling: Secondary | ICD-10-CM | POA: Diagnosis not present

## 2020-06-18 DIAGNOSIS — O09612 Supervision of young primigravida, second trimester: Secondary | ICD-10-CM | POA: Diagnosis not present

## 2020-07-09 DIAGNOSIS — Z3A27 27 weeks gestation of pregnancy: Secondary | ICD-10-CM | POA: Diagnosis not present

## 2020-07-09 DIAGNOSIS — Z23 Encounter for immunization: Secondary | ICD-10-CM | POA: Diagnosis not present

## 2020-07-09 DIAGNOSIS — Z3689 Encounter for other specified antenatal screening: Secondary | ICD-10-CM | POA: Diagnosis not present

## 2020-07-26 DIAGNOSIS — Z7189 Other specified counseling: Secondary | ICD-10-CM | POA: Diagnosis not present

## 2020-07-26 DIAGNOSIS — Z3403 Encounter for supervision of normal first pregnancy, third trimester: Secondary | ICD-10-CM | POA: Diagnosis not present

## 2020-07-26 DIAGNOSIS — Z3A29 29 weeks gestation of pregnancy: Secondary | ICD-10-CM | POA: Diagnosis not present

## 2020-07-31 ENCOUNTER — Encounter (HOSPITAL_COMMUNITY): Payer: Self-pay | Admitting: Obstetrics and Gynecology

## 2020-07-31 ENCOUNTER — Other Ambulatory Visit: Payer: Self-pay

## 2020-07-31 ENCOUNTER — Inpatient Hospital Stay (HOSPITAL_COMMUNITY)
Admission: AD | Admit: 2020-07-31 | Discharge: 2020-07-31 | Disposition: A | Discharge status: Home / Self Care | Payer: Medicaid Other | Attending: Obstetrics and Gynecology | Admitting: Obstetrics and Gynecology

## 2020-07-31 DIAGNOSIS — O4693 Antepartum hemorrhage, unspecified, third trimester: Secondary | ICD-10-CM | POA: Insufficient documentation

## 2020-07-31 DIAGNOSIS — Z3689 Encounter for other specified antenatal screening: Secondary | ICD-10-CM

## 2020-07-31 DIAGNOSIS — O99333 Smoking (tobacco) complicating pregnancy, third trimester: Secondary | ICD-10-CM | POA: Diagnosis not present

## 2020-07-31 DIAGNOSIS — Z3A30 30 weeks gestation of pregnancy: Secondary | ICD-10-CM | POA: Diagnosis not present

## 2020-07-31 DIAGNOSIS — F17210 Nicotine dependence, cigarettes, uncomplicated: Secondary | ICD-10-CM | POA: Diagnosis not present

## 2020-07-31 DIAGNOSIS — N939 Abnormal uterine and vaginal bleeding, unspecified: Secondary | ICD-10-CM

## 2020-07-31 DIAGNOSIS — O26853 Spotting complicating pregnancy, third trimester: Secondary | ICD-10-CM

## 2020-07-31 LAB — URINALYSIS, ROUTINE W REFLEX MICROSCOPIC
Bilirubin Urine: NEGATIVE
Glucose, UA: NEGATIVE mg/dL
Hgb urine dipstick: NEGATIVE
Ketones, ur: NEGATIVE mg/dL
Leukocytes,Ua: NEGATIVE
Nitrite: NEGATIVE
Protein, ur: 30 mg/dL — AB
Specific Gravity, Urine: 1.029 (ref 1.005–1.030)
pH: 6 (ref 5.0–8.0)

## 2020-07-31 NOTE — Discharge Instructions (Signed)
Fetal Movement Counts Patient Name: ________________________________________________ Patient Due Date: ____________________ What is a fetal movement count?  A fetal movement count is the number of times that you feel your baby move during a certain amount of time. This may also be called a fetal kick count. A fetal movement count is recommended for every pregnant woman. You may be asked to start counting fetal movements as early as week 28 of your pregnancy. Pay attention to when your baby is most active. You may notice your baby's sleep and wake cycles. You may also notice things that make your baby move more. You should do a fetal movement count:  When your baby is normally most active.  At the same time each day. A good time to count movements is while you are resting, after having something to eat and drink. How do I count fetal movements? 1. Find a quiet, comfortable area. Sit, or lie down on your side. 2. Write down the date, the start time and stop time, and the number of movements that you felt between those two times. Take this information with you to your health care visits. 3. Write down your start time when you feel the first movement. 4. Count kicks, flutters, swishes, rolls, and jabs. You should feel at least 10 movements. 5. You may stop counting after you have felt 10 movements, or if you have been counting for 2 hours. Write down the stop time. 6. If you do not feel 10 movements in 2 hours, contact your health care provider for further instructions. Your health care provider may want to do additional tests to assess your baby's well-being. Contact a health care provider if:  You feel fewer than 10 movements in 2 hours.  Your baby is not moving like he or she usually does. Date: ____________ Start time: ____________ Stop time: ____________ Movements: ____________ Date: ____________ Start time: ____________ Stop time: ____________ Movements: ____________ Date: ____________  Start time: ____________ Stop time: ____________ Movements: ____________ Date: ____________ Start time: ____________ Stop time: ____________ Movements: ____________ Date: ____________ Start time: ____________ Stop time: ____________ Movements: ____________ Date: ____________ Start time: ____________ Stop time: ____________ Movements: ____________ Date: ____________ Start time: ____________ Stop time: ____________ Movements: ____________ Date: ____________ Start time: ____________ Stop time: ____________ Movements: ____________ Date: ____________ Start time: ____________ Stop time: ____________ Movements: ____________ This information is not intended to replace advice given to you by your health care provider. Make sure you discuss any questions you have with your health care provider. Document Revised: 04/28/2019 Document Reviewed: 04/28/2019 Elsevier Patient Education  2020 Elsevier Inc. Vaginal Bleeding During Pregnancy, Third Trimester  A small amount of bleeding from the vagina (spotting) is relatively common during pregnancy. Various things can cause bleeding or spotting during pregnancy. Sometimes bleeding is normal and is not a problem. However, bleeding during the third trimester can also be a sign of something serious for the mother and the baby. Be sure to tell your health care provider about any vaginal bleeding right away. Some possible causes of vaginal bleeding during the third trimester include:  Infection or growths (polyps) on the cervix.  A condition in which the placenta partially or completely covers the opening of the cervix inside the uterus (placenta previa).  The placenta separating from the uterus (placenta abruption).  The start of labor (discharging of the mucus plug).  A condition in which the placenta grows into the muscle layer of the uterus (placenta accreta). Follow these instructions at home: Activity  Follow instructions   from your health care provider  about limiting your activity. If your health care provider recommends activity restriction, you may need to stay in bed and only get up to use the bathroom. In some cases, your health care provider may allow you to continue light activity.  If needed, make plans for someone to help with your regular activities.  Ask your health care provider if it is safe for you to drive.  Do not lift anything that is heavier than 10 lb (4.5 kg), or the limit that your health care provider tells you, until he or she says that it is safe.  Do not have sex or orgasms until your health care provider says that this is safe. Medicines  Take over-the-counter and prescription medicines only as told by your health care provider.  Do not take aspirin because it can cause bleeding. General instructions  Pay attention to any changes in your symptoms.  Write down how many pads you use each day, how often you change pads, and how soaked (saturated) they are.  Do not use tampons or douche.  If you pass any tissue from your vagina, save the tissue so you can show it to your health care provider.  Keep all follow-up visits as told by your health care provider. This is important. Contact a health care provider if:  You have vaginal bleeding during any part of your pregnancy.  You have cramps or labor pains.  You have a fever. Get help right away if:  You have severe cramps or pain in your back or abdomen.  You have a gush of fluid from the vagina.  You pass large clots or a large amount of tissue from your vagina.  Your bleeding increases.  You feel light-headed or weak.  You faint.  You feel that your baby is moving less than usual, or not moving at all. Summary  Various things can cause bleeding or spotting in pregnancy.  Bleeding during the third trimester can be a sign of a serious problem for the mother and the baby.  Be sure to tell your health care provider about any vaginal bleeding right  away. This information is not intended to replace advice given to you by your health care provider. Make sure you discuss any questions you have with your health care provider. Document Revised: 12/28/2018 Document Reviewed: 12/11/2016 Elsevier Patient Education  2020 Elsevier Inc.  

## 2020-07-31 NOTE — MAU Provider Note (Signed)
History     CSN: [id number]  Arrival date and time: 07/31/20 2016   First Provider Initiated Contact with Patient 07/31/20 2126      Chief Complaint  Patient presents with  . Vaginal Bleeding   HPI Kayla Church is a 19 y.o. G1P0 at [redacted]w[redacted]d who presents with vaginal bleeding. Reports light pink spotting on toilet paper today. Not bleeding into a pad or passing blood clots. Denies abdominal pain, recent abdominal trauma, vaginal discharge, dysuria, LOF, or recent intercourse. Reports good fetal movement. Denies complications with this pregnancy. Goes to [facility] ob/gyn; has f/u there in a week.   OB History    Gravida  1   Para      Term      Preterm      AB      Living        SAB      TAB      Ectopic      Multiple      Live Births              Past Medical History:  Diagnosis Date  . ADD (attention deficit disorder)    with inadequate control  . Left knee injury 4/12   bruise   . Sinus arrhythmia     Past Surgical History:  Procedure Laterality Date  . NO PAST SURGERIES      Family History  Problem Relation Age of Onset  . Bipolar disorder Father     Social History   Tobacco Use  . Smoking status: Current Every Day Smoker    Packs/day: 0.25    Types: Cigarettes  . Smokeless tobacco: Never Used  . Tobacco comment: pt reports 1-2 per day  Vaping Use  . Vaping Use: Never used  Substance Use Topics  . Alcohol use: No  . Drug use: Yes    Types: Marijuana    Comment: pt reports she stopped 5/18    Allergies: No Known Allergies  Medications Prior to Admission  Medication Sig Dispense Refill Last Dose  . Prenatal Vit-Fe Fumarate-FA (PRENATAL MULTIVITAMIN) TABS tablet Take 1 tablet by mouth daily at 12 noon.   07/31/2020 at Unknown time  . Doxylamine-Pyridoxine 10-10 MG TBEC Take 1 tablet by mouth 2 (two) times daily as needed (nausea). 60 tablet 0     Review of Systems  Constitutional: Negative.   Gastrointestinal: Negative.    Genitourinary: Positive for vaginal bleeding. Negative for dysuria and vaginal discharge.   Physical Exam   Blood pressure (!) 122/55, pulse 97, temperature 98.5 F (36.9 C), resp. rate 16, height 5\' 2"  (1.575 m), weight 92.1 kg, last menstrual period 01/02/2020, SpO2 100 %.  Physical Exam Vitals and nursing note reviewed. Exam conducted with a chaperone present.  Constitutional:      General: She is not in acute distress.    Appearance: Normal appearance.  HENT:     Head: Normocephalic and atraumatic.  Pulmonary:     Effort: Pulmonary effort is normal. No respiratory distress.  Abdominal:     Tenderness: There is no abdominal tenderness.     Comments: Gravid uterus  Genitourinary:    General: Normal vulva.     Exam position: Lithotomy position.     Cervix: Normal.     Comments: No blood. Small amount of physiologic discharge. Cervix pink & smooth.   Dilation: Closed Effacement (%): Thick Cervical Position: Posterior Station: -3 Exam by:: [email or web address] NP  Neurological:  Mental Status: She is alert.    Fetal Tracing:  Baseline: 135 Variability: moderate  Accelerations: 15x15 Decelerations: none  Toco: none   MAU Course  Procedures No results found for this or any previous visit (from the past 24 hour(s)).  MDM Patient presents with complaint of vaginal bleeding that she describes as light pink spotting. No bleeding or abnormal discharge on exam. Cervix closed. Reactive fetal tracing. Patient is RH positive  Assessment and Plan   1. Vaginal spotting   2. NST (non-stress test) reactive   3. [redacted] weeks gestation of pregnancy    -Reviewed bleeding precautions & reasons to return to MAU  Judeth Horn 07/31/2020, 9:36 PM

## 2020-07-31 NOTE — MAU Note (Signed)
Pt reports spotting this pm, some lower abd cramping. Reports good fetal movement. Last intercourse >48 hours ago.

## 2020-09-10 DIAGNOSIS — Z3400 Encounter for supervision of normal first pregnancy, unspecified trimester: Secondary | ICD-10-CM | POA: Diagnosis not present

## 2020-09-12 DIAGNOSIS — Z3685 Encounter for antenatal screening for Streptococcus B: Secondary | ICD-10-CM | POA: Diagnosis not present

## 2020-09-12 DIAGNOSIS — O99343 Other mental disorders complicating pregnancy, third trimester: Secondary | ICD-10-CM | POA: Diagnosis not present

## 2020-09-12 DIAGNOSIS — Z3A36 36 weeks gestation of pregnancy: Secondary | ICD-10-CM | POA: Diagnosis not present

## 2020-09-12 DIAGNOSIS — O09613 Supervision of young primigravida, third trimester: Secondary | ICD-10-CM | POA: Diagnosis not present

## 2020-09-12 DIAGNOSIS — Z113 Encounter for screening for infections with a predominantly sexual mode of transmission: Secondary | ICD-10-CM | POA: Diagnosis not present

## 2020-09-12 DIAGNOSIS — O99333 Smoking (tobacco) complicating pregnancy, third trimester: Secondary | ICD-10-CM | POA: Diagnosis not present

## 2020-09-12 DIAGNOSIS — O0933 Supervision of pregnancy with insufficient antenatal care, third trimester: Secondary | ICD-10-CM | POA: Diagnosis not present

## 2020-09-12 LAB — OB RESULTS CONSOLE GC/CHLAMYDIA
Chlamydia: NEGATIVE
Gonorrhea: NEGATIVE

## 2020-09-12 LAB — OB RESULTS CONSOLE GBS: GBS: NEGATIVE

## 2020-09-17 DIAGNOSIS — Z20822 Contact with and (suspected) exposure to covid-19: Secondary | ICD-10-CM | POA: Diagnosis not present

## 2020-09-22 NOTE — L&D Delivery Note (Signed)
Delivery Note Pt reached complete dilation and pushed well for about 40 minutes. At 1:46 AM a healthy female was delivered via Vaginal, Spontaneous (Presentation: Right Occiput Anterior).  APGAR: 8, 9; weight  pending.   Placenta status: Spontaneous, Intact.  Mild uterine atony responded to pitocin, massage and methergine x 1.  Cord: 3 vessels with the following complications: Tight nuchal x 1 delivered through.  Anesthesia: Epidural Episiotomy: None Lacerations: 1st degree Suture Repair: 3.0 vicryl rapide Est. Blood Loss (mL):   Mom to postpartum.  Baby to Couplet care / Skin to Skin.  Kayla Church 10/16/2020, 2:22 AM

## 2020-09-27 ENCOUNTER — Telehealth (HOSPITAL_COMMUNITY): Payer: Self-pay | Admitting: *Deleted

## 2020-09-27 ENCOUNTER — Encounter (HOSPITAL_COMMUNITY): Payer: Self-pay | Admitting: *Deleted

## 2020-09-27 NOTE — Telephone Encounter (Signed)
Preadmission screen  

## 2020-10-06 ENCOUNTER — Other Ambulatory Visit (HOSPITAL_COMMUNITY)
Admission: RE | Admit: 2020-10-06 | Discharge: 2020-10-06 | Disposition: A | Discharge status: Home / Self Care | Payer: Medicaid Other | Source: Ambulatory Visit | Attending: Obstetrics and Gynecology | Admitting: Obstetrics and Gynecology

## 2020-10-06 DIAGNOSIS — Z20822 Contact with and (suspected) exposure to covid-19: Secondary | ICD-10-CM | POA: Insufficient documentation

## 2020-10-06 DIAGNOSIS — Z01812 Encounter for preprocedural laboratory examination: Secondary | ICD-10-CM | POA: Diagnosis not present

## 2020-10-06 LAB — SARS CORONAVIRUS 2 (TAT 6-24 HRS): SARS Coronavirus 2: NEGATIVE

## 2020-10-08 ENCOUNTER — Other Ambulatory Visit: Payer: Self-pay | Admitting: Obstetrics and Gynecology

## 2020-10-08 ENCOUNTER — Inpatient Hospital Stay (HOSPITAL_COMMUNITY): Payer: Medicaid Other

## 2020-10-10 DIAGNOSIS — Z3A40 40 weeks gestation of pregnancy: Secondary | ICD-10-CM | POA: Diagnosis not present

## 2020-10-10 DIAGNOSIS — O480 Post-term pregnancy: Secondary | ICD-10-CM | POA: Diagnosis not present

## 2020-10-12 ENCOUNTER — Encounter (HOSPITAL_COMMUNITY): Payer: Self-pay

## 2020-10-13 ENCOUNTER — Other Ambulatory Visit: Payer: Self-pay | Admitting: Obstetrics and Gynecology

## 2020-10-13 ENCOUNTER — Other Ambulatory Visit (HOSPITAL_COMMUNITY)
Admission: RE | Admit: 2020-10-13 | Discharge: 2020-10-13 | Disposition: A | Discharge status: Home / Self Care | Payer: Medicaid Other | Source: Ambulatory Visit | Attending: Obstetrics and Gynecology | Admitting: Obstetrics and Gynecology

## 2020-10-13 DIAGNOSIS — U071 COVID-19: Secondary | ICD-10-CM | POA: Insufficient documentation

## 2020-10-13 DIAGNOSIS — Z01812 Encounter for preprocedural laboratory examination: Secondary | ICD-10-CM | POA: Diagnosis present

## 2020-10-13 LAB — SARS CORONAVIRUS 2 (TAT 6-24 HRS): SARS Coronavirus 2: POSITIVE — AB

## 2020-10-13 NOTE — H&P (View-Only) (Signed)
Kayla Church is a 20 y.o. female G1P0 at 46 weeks (EDD 10/09/19 by LMP c/w 15 weeks [place]) presenting for IOL.  Prenatal care significant for late start at 15 weeks and + smoker status.  She cutdown with pregnancy to 1-3 cigs/daily.  Teenage pregnancy, lives with FOB.   OB History    Gravida  1   Para      Term      Preterm      AB      Living        SAB      IAB      Ectopic      Multiple      Live Births             Past Medical History:  Diagnosis Date  . ADD (attention deficit disorder)    with inadequate control  . Left knee injury 4/12   bruise   . Sinus arrhythmia    Past Surgical History:  Procedure Laterality Date  . NO PAST SURGERIES     Family History: family history includes Bipolar disorder in her father. Social History:  reports that she has been smoking cigarettes. She has been smoking about 0.25 packs per day. She has never used smokeless tobacco. She reports current drug use. Drug: Marijuana. She reports that she does not drink alcohol.     Maternal Diabetes: No Genetic Screening: Normal Maternal Ultrasounds/Referrals: Normal Fetal Ultrasounds or other Referrals:  None Maternal Substance Abuse:  Yes:  Type: Marijuana Significant Maternal Medications:  None Significant Maternal Lab Results:  Group B Strep negative Other Comments:  None  Review of Systems  Constitutional: Negative for fever.  Gastrointestinal: Negative for abdominal pain.   Maternal Medical History:  Contractions: Frequency: irregular.   Perceived severity is mild.    Fetal activity: Perceived fetal activity is normal.    Prenatal complications: smoker  Prenatal Complications - Diabetes: none.      Last menstrual period 01/02/2020. Maternal Exam:  Uterine Assessment: Contraction strength is mild.  Contraction frequency is irregular.   Abdomen: Fetal presentation: vertex  Introitus: Normal vulva. Normal vagina.    Physical Exam Constitutional:       Appearance: Normal appearance.  Cardiovascular:     Rate and Rhythm: Normal rate and regular rhythm.  Pulmonary:     Effort: Pulmonary effort is normal.  Abdominal:     Palpations: Abdomen is soft.  Genitourinary:    General: Normal vulva.  Neurological:     Mental Status: She is alert.  Psychiatric:        Behavior: Behavior normal.     Prenatal labs: ABO, Rh: O/Positive/-- (07/28 0000) Antibody: Negative (07/28 0000) Rubella: Immune (07/28 0000) RPR:   NR HBsAg: Negative (07/28 0000)  HIV: Non-reactive (07/28 0000)  GBS: Negative/-- (12/22 0000)  One hour GCT 117 Hgb AA NIPT low risk  Assessment/Plan: Pt for ripening with cytotec and IOL at term.  Pitocin when more favorable.  Epidural prn   Oliver Pila 10/13/2020, 4:03 PM

## 2020-10-13 NOTE — H&P (Signed)
Kayla Church is a 19 y.o. female G1P0 at 41 weeks (EDD 10/09/19 by LMP c/Kayla 15 weeks [place]) presenting for IOL.  Prenatal care significant for late start at 15 weeks and + smoker status.  She cutdown with pregnancy to 1-3 cigs/daily.  Teenage pregnancy, lives with FOB.   OB History    Gravida  1   Para      Term      Preterm      AB      Living        SAB      IAB      Ectopic      Multiple      Live Births             Past Medical History:  Diagnosis Date  . ADD (attention deficit disorder)    with inadequate control  . Left knee injury 4/12   bruise   . Sinus arrhythmia    Past Surgical History:  Procedure Laterality Date  . NO PAST SURGERIES     Family History: family history includes Bipolar disorder in her father. Social History:  reports that she has been smoking cigarettes. She has been smoking about 0.25 packs per day. She has never used smokeless tobacco. She reports current drug use. Drug: Marijuana. She reports that she does not drink alcohol.     Maternal Diabetes: No Genetic Screening: Normal Maternal Ultrasounds/Referrals: Normal Fetal Ultrasounds or other Referrals:  None Maternal Substance Abuse:  Yes:  Type: Marijuana Significant Maternal Medications:  None Significant Maternal Lab Results:  Group B Strep negative Other Comments:  None  Review of Systems  Constitutional: Negative for fever.  Gastrointestinal: Negative for abdominal pain.   Maternal Medical History:  Contractions: Frequency: irregular.   Perceived severity is mild.    Fetal activity: Perceived fetal activity is normal.    Prenatal complications: smoker  Prenatal Complications - Diabetes: none.      Last menstrual period 01/02/2020. Maternal Exam:  Uterine Assessment: Contraction strength is mild.  Contraction frequency is irregular.   Abdomen: Fetal presentation: vertex  Introitus: Normal vulva. Normal vagina.    Physical Exam Constitutional:       Appearance: Normal appearance.  Cardiovascular:     Rate and Rhythm: Normal rate and regular rhythm.  Pulmonary:     Effort: Pulmonary effort is normal.  Abdominal:     Palpations: Abdomen is soft.  Genitourinary:    General: Normal vulva.  Neurological:     Mental Status: She is alert.  Psychiatric:        Behavior: Behavior normal.     Prenatal labs: ABO, Rh: O/Positive/-- (07/28 0000) Antibody: Negative (07/28 0000) Rubella: Immune (07/28 0000) RPR:   NR HBsAg: Negative (07/28 0000)  HIV: Non-reactive (07/28 0000)  GBS: Negative/-- (12/22 0000)  One hour GCT 117 Hgb AA NIPT low risk  Assessment/Plan: Pt for ripening with cytotec and IOL at term.  Pitocin when more favorable.  Epidural prn   Kayla Church Kayla Church 10/13/2020, 4:03 PM    

## 2020-10-14 ENCOUNTER — Inpatient Hospital Stay (HOSPITAL_COMMUNITY): Payer: Medicaid Other

## 2020-10-14 ENCOUNTER — Other Ambulatory Visit: Payer: Self-pay | Admitting: Obstetrics and Gynecology

## 2020-10-14 NOTE — H&P (Signed)
Pt notified of +COVID test and that she can only bring one support person to hospital.  She is aware they must stay the whole time in her room.  Her current sx are just mild congestion.

## 2020-10-14 NOTE — Progress Notes (Signed)
Dr. Senaida Ores sent message about covid 19 results.

## 2020-10-15 ENCOUNTER — Inpatient Hospital Stay (HOSPITAL_COMMUNITY): Payer: Medicaid Other | Admitting: Anesthesiology

## 2020-10-15 ENCOUNTER — Inpatient Hospital Stay (HOSPITAL_COMMUNITY): Payer: Medicaid Other

## 2020-10-15 ENCOUNTER — Inpatient Hospital Stay (HOSPITAL_COMMUNITY)
Admission: RE | Admit: 2020-10-15 | Discharge: 2020-10-17 | DRG: 805 | Disposition: A | Discharge status: Home / Self Care | Payer: Medicaid Other | Attending: Obstetrics and Gynecology | Admitting: Obstetrics and Gynecology

## 2020-10-15 ENCOUNTER — Encounter (HOSPITAL_COMMUNITY): Payer: Self-pay | Admitting: Obstetrics and Gynecology

## 2020-10-15 ENCOUNTER — Other Ambulatory Visit: Payer: Self-pay

## 2020-10-15 DIAGNOSIS — F17210 Nicotine dependence, cigarettes, uncomplicated: Secondary | ICD-10-CM | POA: Diagnosis present

## 2020-10-15 DIAGNOSIS — O9852 Other viral diseases complicating childbirth: Secondary | ICD-10-CM | POA: Diagnosis present

## 2020-10-15 DIAGNOSIS — O99214 Obesity complicating childbirth: Secondary | ICD-10-CM | POA: Diagnosis present

## 2020-10-15 DIAGNOSIS — U071 COVID-19: Secondary | ICD-10-CM | POA: Diagnosis present

## 2020-10-15 DIAGNOSIS — O99334 Smoking (tobacco) complicating childbirth: Secondary | ICD-10-CM | POA: Diagnosis present

## 2020-10-15 DIAGNOSIS — O480 Post-term pregnancy: Secondary | ICD-10-CM | POA: Diagnosis present

## 2020-10-15 DIAGNOSIS — O99324 Drug use complicating childbirth: Secondary | ICD-10-CM | POA: Diagnosis present

## 2020-10-15 DIAGNOSIS — F1290 Cannabis use, unspecified, uncomplicated: Secondary | ICD-10-CM | POA: Diagnosis present

## 2020-10-15 DIAGNOSIS — Z3A41 41 weeks gestation of pregnancy: Secondary | ICD-10-CM

## 2020-10-15 LAB — TYPE AND SCREEN
ABO/RH(D): O POS
Antibody Screen: NEGATIVE

## 2020-10-15 LAB — CBC
HCT: 37.5 % (ref 36.0–46.0)
Hemoglobin: 11.8 g/dL — ABNORMAL LOW (ref 12.0–15.0)
MCH: 28 pg (ref 26.0–34.0)
MCHC: 31.5 g/dL (ref 30.0–36.0)
MCV: 88.9 fL (ref 80.0–100.0)
Platelets: 211 10*3/uL (ref 150–400)
RBC: 4.22 MIL/uL (ref 3.87–5.11)
RDW: 13.9 % (ref 11.5–15.5)
WBC: 12.4 10*3/uL — ABNORMAL HIGH (ref 4.0–10.5)
nRBC: 0 % (ref 0.0–0.2)

## 2020-10-15 LAB — RPR: RPR Ser Ql: NONREACTIVE

## 2020-10-15 MED ORDER — EPHEDRINE 5 MG/ML INJ: 10.0000 mg | INTRAVENOUS | Status: DC | PRN

## 2020-10-15 MED ORDER — OXYCODONE-ACETAMINOPHEN 5-325 MG PO TABS: 1.0000 | ORAL_TABLET | ORAL | Status: DC | PRN

## 2020-10-15 MED ORDER — ACETAMINOPHEN 325 MG PO TABS: 650.0000 mg | ORAL_TABLET | ORAL | Status: DC | PRN

## 2020-10-15 MED ORDER — LACTATED RINGERS IV SOLN: 500.0000 mL | Freq: Once | INTRAVENOUS | Status: DC

## 2020-10-15 MED ORDER — OXYTOCIN BOLUS FROM INFUSION
333.0000 mL | Freq: Once | INTRAVENOUS | Status: AC
  Administered 2020-10-16: 333 mL via INTRAVENOUS

## 2020-10-15 MED ORDER — LACTATED RINGERS IV SOLN: INTRAVENOUS | Status: DC

## 2020-10-15 MED ORDER — SOD CITRATE-CITRIC ACID 500-334 MG/5ML PO SOLN: 30.0000 mL | ORAL | Status: DC | PRN

## 2020-10-15 MED ORDER — DIPHENHYDRAMINE HCL 50 MG/ML IJ SOLN: 12.5000 mg | INTRAMUSCULAR | Status: DC | PRN

## 2020-10-15 MED ORDER — MISOPROSTOL 25 MCG QUARTER TABLET
25.0000 ug | ORAL_TABLET | ORAL | Status: AC | PRN
  Administered 2020-10-15 (×2): 25 ug via VAGINAL
  Filled 2020-10-15 (×3): qty 1

## 2020-10-15 MED ORDER — LACTATED RINGERS IV SOLN: 500.0000 mL | INTRAVENOUS | Status: DC | PRN

## 2020-10-15 MED ORDER — OXYCODONE-ACETAMINOPHEN 5-325 MG PO TABS
2.0000 | ORAL_TABLET | ORAL | Status: DC | PRN
Start: 2020-10-15 — End: 2020-10-16

## 2020-10-15 MED ORDER — LIDOCAINE HCL (PF) 1 % IJ SOLN
INTRAMUSCULAR | Status: DC | PRN
  Administered 2020-10-15 (×2): 5 mL via EPIDURAL

## 2020-10-15 MED ORDER — SODIUM CHLORIDE (PF) 0.9 % IJ SOLN
INTRAMUSCULAR | Status: DC | PRN
  Administered 2020-10-15: 12 mL/h via EPIDURAL

## 2020-10-15 MED ORDER — OXYTOCIN-SODIUM CHLORIDE 30-0.9 UT/500ML-% IV SOLN
1.0000 m[IU]/min | INTRAVENOUS | Status: DC
  Administered 2020-10-15: 2 m[IU]/min via INTRAVENOUS
  Filled 2020-10-15: qty 500

## 2020-10-15 MED ORDER — PHENYLEPHRINE 40 MCG/ML (10ML) SYRINGE FOR IV PUSH (FOR BLOOD PRESSURE SUPPORT): 80.0000 ug | PREFILLED_SYRINGE | INTRAVENOUS | Status: DC | PRN

## 2020-10-15 MED ORDER — ONDANSETRON HCL 4 MG/2ML IJ SOLN
4.0000 mg | Freq: Four times a day (QID) | INTRAMUSCULAR | Status: DC | PRN
  Administered 2020-10-15: 4 mg via INTRAVENOUS
  Filled 2020-10-15: qty 2

## 2020-10-15 MED ORDER — FENTANYL-BUPIVACAINE-NACL 0.5-0.125-0.9 MG/250ML-% EP SOLN
12.0000 mL/h | EPIDURAL | Status: DC | PRN
  Filled 2020-10-15: qty 250

## 2020-10-15 MED ORDER — TERBUTALINE SULFATE 1 MG/ML IJ SOLN: 0.2500 mg | Freq: Once | INTRAMUSCULAR | Status: DC | PRN

## 2020-10-15 MED ORDER — OXYTOCIN-SODIUM CHLORIDE 30-0.9 UT/500ML-% IV SOLN
2.5000 [IU]/h | INTRAVENOUS | Status: DC
  Administered 2020-10-16: 2.5 [IU]/h via INTRAVENOUS
  Filled 2020-10-15: qty 500

## 2020-10-15 MED ORDER — BUTORPHANOL TARTRATE 1 MG/ML IJ SOLN: 1.0000 mg | INTRAMUSCULAR | Status: DC | PRN

## 2020-10-15 MED ORDER — LIDOCAINE HCL (PF) 1 % IJ SOLN: 30.0000 mL | INTRAMUSCULAR | Status: DC | PRN

## 2020-10-15 NOTE — Progress Notes (Signed)
Patient ID: Kayla Church, female   DOB: 12/26/2000, 20 y.o.   MRN: [id number] Pt got uncomfortable and received epidural.  Now comfortable  afeb VSS FHR category 1 baseline 120  Cervix 90/3/-1  Bloody show  IUPC placed to adjust pitocin Follow progress

## 2020-10-15 NOTE — Anesthesia Procedure Notes (Addendum)
Epidural Patient location during procedure: OB Start time: 10/15/2020 1:03 PM End time: 10/15/2020 1:13 PM  Staffing Anesthesiologist: Heather Roberts, MD Performed: anesthesiologist and resident/CRNA   Preanesthetic Checklist Completed: patient identified, IV checked, site marked, risks and benefits discussed, monitors and equipment checked, pre-op evaluation and timeout performed  Epidural Patient position: sitting Prep: DuraPrep Patient monitoring: heart rate, cardiac monitor, continuous pulse ox and blood pressure Approach: midline Location: L2-L3 Injection technique: LOR saline  Needle:  Needle type: Tuohy  Needle gauge: 17 G Needle length: 9 cm Needle insertion depth: 8 cm Catheter size: 20 Guage Catheter at skin depth: 13 cm Test dose: negative and Other  Assessment Events: blood not aspirated, injection not painful, no injection resistance and negative IV test  Additional Notes Informed consent obtained prior to proceeding including risk of failure, 1% risk of PDPH, risk of minor discomfort and bruising.  Discussed rare but serious complications including epidural abscess, permanent nerve injury, epidural hematoma.  Discussed alternatives to epidural analgesia and patient desires to proceed.  Timeout performed pre-procedure verifying patient name, procedure, and platelet count.  Patient tolerated procedure well.

## 2020-10-15 NOTE — Progress Notes (Signed)
Patient ID: Kayla Church, female   DOB: [birth year], 20 y.o.   MRN: [id number] Pt comfortable with epidural  afeb VSS FHR category 1   Cervix c/8-9/0  Pt progressing, will continue to follow and recheck in 1-2 hours Pitocin at 74mu, MVU 150, adjusting pitocin.

## 2020-10-15 NOTE — Progress Notes (Signed)
Patient ID: Kayla Church, female   DOB: 01/02/2001, 20 y.o.   MRN: [id number] Pt comfortable  afeb VSS  90/4/-1 Likely OP  Pitocin at 16 mu and MVU  Follow progress

## 2020-10-15 NOTE — Interval H&P Note (Signed)
History and Physical Interval Note: Pt admitted overnight and received cytotec x 2, now with mild to moderate contractions. FHR Category 1  Cervix 1/70/-1, anterior  AROM clear Pitocin started Follow progress  Epidural prn    Oliver Pila

## 2020-10-15 NOTE — Anesthesia Preprocedure Evaluation (Addendum)
Anesthesia Evaluation  Patient identified by MRN, date of birth, ID band Patient awake    Reviewed: Allergy & Precautions, H&P , NPO status , Patient's Chart, lab work & pertinent test results  History of Anesthesia Complications Negative for: history of anesthetic complications  Airway Mallampati: III  TM Distance: >3 FB Neck ROM: Full    Dental no notable dental hx. (+) Dental Advisory Given   Pulmonary Current Smoker and Patient abstained from smoking.,  Covid +   Pulmonary exam normal        Cardiovascular negative cardio ROS Normal cardiovascular exam     Neuro/Psych negative neurological ROS  negative psych ROS   GI/Hepatic negative GI ROS, Neg liver ROS,   Endo/Other  Morbid obesity  Renal/GU negative Renal ROS  negative genitourinary   Musculoskeletal negative musculoskeletal ROS (+)   Abdominal   Peds negative pediatric ROS (+)  Hematology negative hematology ROS (+)   Anesthesia Other Findings   Reproductive/Obstetrics (+) Pregnancy                            Anesthesia Physical Anesthesia Plan  ASA: III  Anesthesia Plan: Epidural   Post-op Pain Management:    Induction:   PONV Risk Score and Plan:   Airway Management Planned: Natural Airway  Additional Equipment:   Intra-op Plan:   Post-operative Plan:   Informed Consent:   Plan Discussed with: Anesthesiologist  Anesthesia Plan Comments:        Anesthesia Quick Evaluation

## 2020-10-16 ENCOUNTER — Encounter (HOSPITAL_COMMUNITY): Payer: Self-pay | Admitting: Obstetrics and Gynecology

## 2020-10-16 LAB — CBC
HCT: 34.7 % — ABNORMAL LOW (ref 36.0–46.0)
Hemoglobin: 11.5 g/dL — ABNORMAL LOW (ref 12.0–15.0)
MCH: 28.9 pg (ref 26.0–34.0)
MCHC: 33.1 g/dL (ref 30.0–36.0)
MCV: 87.2 fL (ref 80.0–100.0)
Platelets: 173 10*3/uL (ref 150–400)
RBC: 3.98 MIL/uL (ref 3.87–5.11)
RDW: 14 % (ref 11.5–15.5)
WBC: 19.1 10*3/uL — ABNORMAL HIGH (ref 4.0–10.5)
nRBC: 0 % (ref 0.0–0.2)

## 2020-10-16 MED ORDER — DIPHENHYDRAMINE HCL 25 MG PO CAPS: 25.0000 mg | ORAL_CAPSULE | Freq: Four times a day (QID) | ORAL | Status: DC | PRN

## 2020-10-16 MED ORDER — PRENATAL MULTIVITAMIN CH
1.0000 | ORAL_TABLET | Freq: Every day | ORAL | Status: DC
  Administered 2020-10-16 – 2020-10-17 (×2): 1 via ORAL
  Filled 2020-10-16 (×2): qty 1

## 2020-10-16 MED ORDER — SIMETHICONE 80 MG PO CHEW: 80.0000 mg | CHEWABLE_TABLET | ORAL | Status: DC | PRN

## 2020-10-16 MED ORDER — TRANEXAMIC ACID-NACL 1000-0.7 MG/100ML-% IV SOLN: 1000.0000 mg | INTRAVENOUS | Status: AC

## 2020-10-16 MED ORDER — TETANUS-DIPHTH-ACELL PERTUSSIS 5-2.5-18.5 LF-MCG/0.5 IM SUSY: 0.5000 mL | PREFILLED_SYRINGE | Freq: Once | INTRAMUSCULAR | Status: DC

## 2020-10-16 MED ORDER — COCONUT OIL OIL: 1.0000 "application " | TOPICAL_OIL | Status: DC | PRN

## 2020-10-16 MED ORDER — ONDANSETRON HCL 4 MG PO TABS: 4.0000 mg | ORAL_TABLET | ORAL | Status: DC | PRN

## 2020-10-16 MED ORDER — BENZOCAINE-MENTHOL 20-0.5 % EX AERO
1.0000 "application " | INHALATION_SPRAY | CUTANEOUS | Status: DC | PRN
  Administered 2020-10-16: 1 via TOPICAL
  Filled 2020-10-16: qty 56

## 2020-10-16 MED ORDER — TRANEXAMIC ACID-NACL 1000-0.7 MG/100ML-% IV SOLN
INTRAVENOUS | Status: AC
  Administered 2020-10-16: 1000 mg
  Filled 2020-10-16: qty 100

## 2020-10-16 MED ORDER — ACETAMINOPHEN 325 MG PO TABS
650.0000 mg | ORAL_TABLET | ORAL | Status: DC | PRN
  Administered 2020-10-16 (×3): 650 mg via ORAL
  Filled 2020-10-16 (×3): qty 2

## 2020-10-16 MED ORDER — IBUPROFEN 600 MG PO TABS
600.0000 mg | ORAL_TABLET | Freq: Four times a day (QID) | ORAL | Status: DC
  Administered 2020-10-16 – 2020-10-17 (×6): 600 mg via ORAL
  Filled 2020-10-16 (×6): qty 1

## 2020-10-16 MED ORDER — ZOLPIDEM TARTRATE 5 MG PO TABS: 5.0000 mg | ORAL_TABLET | Freq: Every evening | ORAL | Status: DC | PRN

## 2020-10-16 MED ORDER — DIBUCAINE (PERIANAL) 1 % EX OINT
1.0000 "application " | TOPICAL_OINTMENT | CUTANEOUS | Status: DC | PRN
  Administered 2020-10-16: 1 via RECTAL
  Filled 2020-10-16: qty 28

## 2020-10-16 MED ORDER — ONDANSETRON HCL 4 MG/2ML IJ SOLN: 4.0000 mg | INTRAMUSCULAR | Status: DC | PRN

## 2020-10-16 MED ORDER — METHYLERGONOVINE MALEATE 0.2 MG/ML IJ SOLN
INTRAMUSCULAR | Status: AC
  Filled 2020-10-16: qty 1

## 2020-10-16 MED ORDER — METHYLERGONOVINE MALEATE 0.2 MG/ML IJ SOLN: 0.2000 mg | Freq: Once | INTRAMUSCULAR | Status: DC

## 2020-10-16 MED ORDER — PRENATAL MULTIVITAMIN CH: 1.0000 | ORAL_TABLET | Freq: Every day | ORAL | Status: DC

## 2020-10-16 MED ORDER — SENNOSIDES-DOCUSATE SODIUM 8.6-50 MG PO TABS
2.0000 | ORAL_TABLET | ORAL | Status: DC
  Administered 2020-10-16 – 2020-10-17 (×2): 2 via ORAL
  Filled 2020-10-16 (×2): qty 2

## 2020-10-16 MED ORDER — WITCH HAZEL-GLYCERIN EX PADS
1.0000 "application " | MEDICATED_PAD | CUTANEOUS | Status: DC | PRN
  Administered 2020-10-16: 1 via TOPICAL

## 2020-10-16 NOTE — Progress Notes (Signed)
PPD #0 No problems Afeb, VSS Fundus firm, NT at U-1 Continue routine postpartum care, asymptomatic COVID positive

## 2020-10-16 NOTE — Lactation Note (Signed)
This note was copied from a baby's chart. Lactation Consultation Note  Patient Name: Kayla Church LJQGB'E Date: 10/16/2020 Reason for consult: Follow-up assessment;Term Age:20 hours  Follow-up visit to 14 hours infant, per mother's request. Mother is holding infant, football position , right breast upon arrival. Mother requests assistance with latch. LC noticed sub-optimal position. Repositioned support pillows for football position to right breast. Set pillows for back support. Demonstrated alignment.. Colostrum is easily expressed. Latched infant at once. Noted deep latch, suckling, swallowing. Mother denies discomfort or pain.   Educated mom about deep latch for an optimal breastfeeding session.  Plan:   1-Breastfeeding on demand, ensuring a deep, comfortable latch.  2-Undressing infant and place skin to skin when ready to breastfeed 3-Keep infant awake during breastfeeding session: massaging breast, infant's hand/shoulder/feet 4-Monitor voids and stools as signs good intake.  5-Contact LC as needed for feeds/support/concerns/questions   Maternal Data Formula Feeding for Exclusion: No Has patient been taught Hand Expression?: Yes Does the patient have breastfeeding experience prior to this delivery?: No  Feeding Feeding Type: Breast Fed  LATCH Score Latch: Grasps breast easily, tongue down, lips flanged, rhythmical sucking.  Audible Swallowing: Spontaneous and intermittent  Type of Nipple: Everted at rest and after stimulation  Comfort (Breast/Nipple): Soft / non-tender  Hold (Positioning): Assistance needed to correctly position infant at breast and maintain latch.  LATCH Score: 9  Interventions Interventions: Breast feeding basics reviewed;Assisted with latch;Skin to skin;Breast massage;Hand express;Adjust position;Expressed milk;Position options;Support pillows  Lactation Tools Discussed/Used     Consult Status Consult Status: Follow-up Date:  10/17/20 Follow-up type: In-patient    Kayla Church A Kayla Church 10/16/2020, 4:02 PM

## 2020-10-16 NOTE — Lactation Note (Signed)
This note was copied from a baby's chart. Lactation Consultation Note  Patient Name: Kayla Church JSHFW'Y Date: 10/16/2020 Reason for consult: L&D Initial assessment;Term;1st time breastfeeding Age:20 hours  (LC-Services in L&D no charge) P1 term female infant. Per om, she receives [facility] and she has DEBP at home. LC entered the room, mom was doing  STS with infant.  L&D RN attempted earlier to assist mom with latch and infant wasn't latching. Infant took 4 mls of colostrum by spoon that mom hand express and infant started cuing to BF.  Mom latched infant on her left breast using the football hold position, infant latched with depth and was still BF after 15 minutes when LC left the room. LC discussed mom to BF infant according primal cues, rooting, licking , smacking, hands to mouth, etc. Mom knows to ask RN or LC on MBU if she needs further assistance with latching infant at the breast. LC briefly discussed LC services within the local community.  Maternal Data Formula Feeding for Exclusion: No Has patient been taught Hand Expression?: Yes Does the patient have breastfeeding experience prior to this delivery?: No  Feeding Feeding Type: Breast Fed  LATCH Score Latch: Grasps breast easily, tongue down, lips flanged, rhythmical sucking.  Audible Swallowing: Spontaneous and intermittent  Type of Nipple: Everted at rest and after stimulation  Comfort (Breast/Nipple): Soft / non-tender  Hold (Positioning): Assistance needed to correctly position infant at breast and maintain latch.  LATCH Score: 9  Interventions Interventions: Breast feeding basics reviewed;Assisted with latch;Skin to skin;Breast compression;Adjust position;Support pillows;Breast massage;Hand express;Expressed milk;Position options  Lactation Tools Discussed/Used WIC Program: Yes   Consult Status Consult Status: Follow-up Date: 10/16/20 Follow-up type: In-patient    Danelle Earthly 10/16/2020, 3:49 AM

## 2020-10-16 NOTE — Clinical Social Work Maternal (Signed)
CLINICAL SOCIAL WORK MATERNAL/CHILD NOTE  Patient Details  Name: Kayla Church MRN: [id number] Date of Birth: 03/27/2001  Date:  03-20-2021  Clinical Social Worker Initiating Note:  Hortencia Pilar, LCSW Date/Time: Initiated:  June 07, 2021/0115     Child's Name:  Kayla Church   Biological Parents:  Mother,Father (Kailey Noboa, Horton Marshall)   Need for Interpreter:  None   Reason for Referral:  Behavioral Health Concerns,Current Substance Use/Substance Use During Pregnancy    Address:  [address] [place] [address]    Phone number:  [phone] (home)     Additional phone number: none   Household Members/Support Persons (HM/SP):   Household Member/Support Person 1   HM/SP Name Relationship DOB or Age  HM/SP -1  Shirl Wipperfurth  MOB 02/12/2001  HM/SP -2 Jala Dundon FOB 11/07/1997  HM/SP -3 Tina Haueter PGM  Unknown   HM/SP -4        HM/SP -5        HM/SP -6        HM/SP -7        HM/SP -8          Natural Supports (not living in the home):  Parent   Professional Supports: None   Employment: Unemployed   Type of Work: none reported.   Education:  9 to 11 years   Homebound arranged: No  Financial Resources:  [employer]   Other Resources:  Sales executive ,[facility]   Cultural/Religious Considerations Which May Impact Care:  none reported.   Strengths:  Ability to meet basic needs ,Compliance with medical plan ,Home prepared for child ,Pediatrician chosen   Psychotropic Medications:    none reported.      Pediatrician:    [facility]  Pediatrician List:   [facility]    [place] Other ([facility])  [facility]      Pediatrician Fax Number:    Risk Factors/Current Problems:  None   Cognitive State:  Insightful ,Able to Concentrate ,Alert    Mood/Affect:  Happy ,Interested ,Relaxed ,Comfortable ,Calm    CSW Assessment: CSW consulted due to MOB having a hx of  anxiety and depression as well as MOB reporting THC ise while pregnant. CSW called into MOB's room to address further needs.   CSW congratulated MOB on the birth of infant. CSW advised MOB of CSW's role and the reason for CSW calling into her room to speak with her. MOB expressed that she did use THC in pregnancy due to her mornign sickness. MOB indicated that she used "maybe twice a week if that". MOB expressed to CSW that her last use was within the last three months however MOB is unable to recall exact date. CSW understanding and advised MOB of hospital drug screen policy. MOB was advised that if infants UDS or CDS returns positive then CSW would need to make a CPS report. MOB expressed that she understood and expressed that she has no current or previous CPS hx. MOB expressed that she also had no other substance use in pregnancy.   CSW inquired from [facility] on her mental health hx. MOB reported that she has a hx of anxiety and depression. MOB reported that she was diagnosed "few years ago". MOB expressed use of medication in the past to help manager her anxiety and depression however MOB expressed no current use. MOB expressed that she is not in therapy and declined  resources when asked by CSW. MOB identified no other mental health hx and denies feelings of SI, HI and DV to this CSW.   CSW inquired from MOB on who her supports are in which MOB identified her mother. MOB expressed that she lives with her boyfriend and his mother. MOB indicated that she completed the 11th grade with no homebound arranged at this time. CSW was advised that MOB has all needed items to care for infant with plans for infant to sleep in crib or play pin once arrived home. MOB reported to CSW that she gets WIC and Food Stamps and that she will have infant seen at [facility] for further care.   CSW took time to provide MOB with PPD and SIDS education. MOB was given PPD Checklist in order to keep track of  feelings as they may relate to PPD. MOB thanked CSW and reported no other needs.  CSW will continue to monitor infants UDS and CDS and make CPS report if warranted. No barrier's to discharge at this time.   CSW Plan/Description:  No Further Intervention Required/No Barriers to Discharge,Perinatal Mood and Anxiety Disorder (PMADs) Education,Hospital Drug Screen Policy Information,CSW Will Continue to Monitor Umbilical Cord Tissue Drug Screen Results and Make Report if Warranted    Mehdi Gironda S Lorrayne Ismael, LCSWA 10/16/2020, 2:23 PM  

## 2020-10-16 NOTE — Anesthesia Postprocedure Evaluation (Signed)
Anesthesia Post Note  Patient: Kayla Church  Procedure(s) Performed: AN AD HOC LABOR EPIDURAL     Patient location during evaluation: Mother Baby Anesthesia Type: Epidural Level of consciousness: awake and alert and oriented Pain management: satisfactory to patient Vital Signs Assessment: post-procedure vital signs reviewed and stable Respiratory status: respiratory function stable Cardiovascular status: stable Postop Assessment: no headache, no backache, epidural receding, patient able to bend at knees, no signs of nausea or vomiting, adequate PO intake and able to ambulate Anesthetic complications: no   No complications documented.  Last Vitals:  Vitals:   10/16/20 0430 10/16/20 0530  BP: 128/78 (!) 115/57  Pulse: 75 81  Resp: 18 18  Temp: 36.7 C   SpO2: 100% 97%    Last Pain:  Vitals:   10/16/20 0530  TempSrc:   PainSc: 4    Pain Goal: Patients Stated Pain Goal: 3 (10/16/20 0530)                 Karleen Dolphin

## 2020-10-17 MED ORDER — IBUPROFEN 800 MG PO TABS: 800.0000 mg | ORAL_TABLET | Freq: Three times a day (TID) | ORAL | 1 refills | Status: DC | PRN

## 2020-10-17 NOTE — Discharge Summary (Signed)
Postpartum Discharge Summary  Date of Service updated     Patient Name: Kayla Church DOB: 07-20-2001 MRN: [id number]  Date of admission: 10/15/2020 Delivery date:10/16/2020  Delivering provider: Huel Cote  Date of discharge: 10/17/2020  Admitting diagnosis: Indication for care in labor and delivery, antepartum [O75.9] NSVD (normal spontaneous vaginal delivery) [O80] Intrauterine pregnancy: [redacted]w[redacted]d     Secondary diagnosis:  Active Problems:   Indication for care in labor and delivery, antepartum   NSVD (normal spontaneous vaginal delivery)  Additional problems: COVID pos    Discharge diagnosis: Term Pregnancy Delivered                                              Post partum procedures:none Augmentation: AROM, Pitocin and Cytotec Complications: None  Hospital course: Induction of Labor With Vaginal Delivery   20 y.o. yo G1P1001 at [redacted]w[redacted]d was admitted to the hospital 10/15/2020 for induction of labor.  Indication for induction: Favorable cervix at term.  Patient had an uncomplicated labor course as follows: Membrane Rupture Time/Date: 8:45 AM ,10/15/2020   Delivery Method:Vaginal, Spontaneous  Episiotomy: None  Lacerations:  1st degree  Details of delivery can be found in separate delivery note.  Patient had a routine postpartum course. Patient is discharged home 10/17/20.  Newborn Data: Birth date:10/16/2020  Birth time:1:46 AM  Gender:Female  Living status:Living  Apgars:8 ,9  Weight:3425 g    Physical exam  Vitals:   10/16/20 1302 10/16/20 1726 10/16/20 2230 10/17/20 0525  BP: (!) 99/59 (!) 109/54 (!) 103/56 116/70  Pulse: 64 74 77 73  Resp: 16 18 18 18   Temp: 98 F (36.7 C) 97.8 F (36.6 C) 98.4 F (36.9 C)   TempSrc: Oral Oral Oral   SpO2: 100% 99% 99% 100%  Weight:      Height:       General: alert, cooperative and no distress Lochia: appropriate Uterine Fundus: firm Incision: N/A DVT Evaluation: No evidence of DVT seen on physical  exam. Negative Homan's sign. No cords or calf tenderness. Labs: Lab Results  Component Value Date   WBC 19.1 (H) 10/16/2020   HGB 11.5 (L) 10/16/2020   HCT 34.7 (L) 10/16/2020   MCV 87.2 10/16/2020   PLT 173 10/16/2020   CMP Latest Ref Rng & Units 03/10/2020  Glucose 70 - 99 mg/dL 93  BUN 6 - 20 mg/dL 6  Creatinine 03/12/2020 - 4.09 mg/dL 8.11  Sodium 9.14 - 782 mmol/L 134(L)  Potassium 3.5 - 5.1 mmol/L 3.7  Chloride 98 - 111 mmol/L 105  CO2 22 - 32 mmol/L 23  Calcium 8.9 - 10.3 mg/dL 956)  Total Protein 6.5 - 8.1 g/dL -  Total Bilirubin 0.3 - 1.2 mg/dL -  Alkaline Phos 38 - 2.1(H U/L -  AST 15 - 41 U/L -  ALT 0 - 44 U/L -   Edinburgh Score: No flowsheet data found.    After visit meds:  Allergies as of 10/17/2020   No Known Allergies     Medication List    STOP taking these medications   Doxylamine-Pyridoxine 10-10 MG Tbec   ferrous sulfate 325 (65 FE) MG tablet   TYLENOL COLD MULTI-SYMPTOM DAY PO     TAKE these medications   ibuprofen 800 MG tablet Commonly known as: ADVIL Take 1 tablet (800 mg total) by mouth every 8 (eight) hours as  needed.   prenatal multivitamin Tabs tablet Take 1 tablet by mouth daily at 12 noon.        Discharge home in stable condition Infant Feeding: Bottle and Breast Infant Disposition:home with mother Discharge instruction: per After Visit Summary and Postpartum booklet. Activity: Advance as tolerated. Pelvic rest for 6 weeks.  Diet: routine diet Anticipated Birth Control: Unsure Postpartum Appointment:6 weeks Future Appointments:No future appointments. Follow up Visit:      10/17/2020 Carlisle Cater, MD

## 2020-10-17 NOTE — Lactation Note (Signed)
This note was copied from a baby's chart. Lactation Consultation Note  Patient Name: Kayla Church TIRWE'R Date: 10/17/2020 Reason for consult: Follow-up assessment;Primapara;1st time breastfeeding;Term, + Cov ( mom ) isolation precautions maintained.  Age:20 hours  Per mom the baby last fed at 9:40 am and took 30 ml. Baby asleep in dads arms.  Per mom having some nipple soreness. LC offered to assess and mom receptive.  No breakdown or discoloration noted. LC reviewed hand expressing and recommended EBM to nipples. LC noted compressible soft areolas and showed mom how deep onto the breast a baby's mouth should be when properly latched.  LC reminded mom when latching , aim her nipple towards the baby's nose , tap upper lip and wait for the wide open mouth and latch with breast compressions until comfortable.  Sore nipple and engorgement prevention and tx reviewed.  Mom has her own DEBP - [place].  LC provided the [facility] brochure with resource numbers and web site.    Maternal Data Has patient been taught Hand Expression?: Yes (LC reviewed during assessment of breast tissue due to moms complaint the tissue is sore. no break down, LC showed mom how deep onto the breast show latch to prevent break down.)  Feeding Feeding Type:  (baby recently fed) Nipple Type: Slow - flow  LATCH Score ( Latch Score  Latch: Repeated attempts needed to sustain latch, nipple held in mouth throughout feeding, stimulation needed to elicit sucking reflex.  Audible Swallowing: A few with stimulation  Type of Nipple: Everted at rest and after stimulation  Comfort (Breast/Nipple): Filling, red/small blisters or bruises, mild/mod discomfort  Hold (Positioning): No assistance needed to correctly position infant at breast.  LATCH Score: 7  Interventions Interventions: Breast feeding basics reviewed  Lactation Tools Discussed/Used     Consult Status Consult Status: Complete Date: 10/17/20    Kathrin Greathouse 10/17/2020, 11:49 AM

## 2020-10-17 NOTE — Progress Notes (Signed)
POSTPARTUM PROGRESS NOTE  Post Partum Day #1  Subjective:  No acute events overnight.  Pt denies problems with ambulating, voiding or po intake.  She denies nausea or vomiting.  Pain is well controlled.   Lochia Minimal. Baby both breast and bottlefeeding. Reviewed possible discharge today vs tomorrow morning.  Objective: Blood pressure 116/70, pulse 73, temperature 98.4 F (36.9 C), temperature source Oral, resp. rate 18, height 5\' 2"  (1.575 m), weight 100.2 kg, last menstrual period 01/02/2020, SpO2 100 %, unknown if currently breastfeeding.  Physical Exam:  General: alert, cooperative and no distress Lochia:normal flow Chest: CTAB Heart: RRR no m/r/g Abdomen: +BS, soft, nontender Uterine Fundus: firm, 2cm below umbilicus GU: suture intact, healing well, no purulent drainage Extremities: neg edema, neg calf TTP BL, neg Homans BL  Recent Labs    10/15/20 0030 10/16/20 0522  HGB 11.8* 11.5*  HCT 37.5 34.7*    Assessment/Plan:  ASSESSMENT: Kayla Church is a 20 y.o. G1P1001 s/p SVD @ [redacted]w[redacted]d. PNC c/b COVID pos, tobacco use, teen pregnancy.   Breastfeeding  Pending peds clearance for baby girl, discharge today vs tomorrow AM. Minimal congestion from COVID this morning   LOS: 2 days

## 2020-10-17 NOTE — Lactation Note (Signed)
This note was copied from a baby's chart. Lactation Consultation Note Baby 28 hrs old at time of consult. RN stated mom has been frustrated d/t cluster feeding requesting formula. Baby latched well. BF well.  Discussed newborn feeding habits and behavior. Baby had a good latch, heard occasional swallow. Encouraged to occasional breast massage. Praised mom. Hand expressed colostrum easily. Mom has no further questions at this time.  Patient Name: Kayla Church [id number] Date: 10/17/2020 Reason for consult: Follow-up assessment;Primapara Age:31 hours  Maternal Data    Feeding Feeding Type: Breast Fed  LATCH Score Latch: Grasps breast easily, tongue down, lips flanged, rhythmical sucking.  Audible Swallowing: A few with stimulation  Type of Nipple: Flat  Comfort (Breast/Nipple): Soft / non-tender  Hold (Positioning): No assistance needed to correctly position infant at breast.  LATCH Score: 8  Interventions Interventions: Breast feeding basics reviewed;Support pillows;Skin to skin;Breast massage;Breast compression  Lactation Tools Discussed/Used     Consult Status Consult Status: Follow-up Date: 10/17/20 Follow-up type: In-patient    Charyl Dancer 10/17/2020, 6:58 AM

## 2020-11-14 ENCOUNTER — Encounter: Payer: Self-pay | Admitting: Family Medicine

## 2020-11-14 ENCOUNTER — Ambulatory Visit (INDEPENDENT_AMBULATORY_CARE_PROVIDER_SITE_OTHER): Payer: Medicaid Other | Admitting: Family Medicine

## 2020-11-14 DIAGNOSIS — O9279 Other disorders of lactation: Secondary | ICD-10-CM | POA: Diagnosis not present

## 2020-11-14 NOTE — Progress Notes (Signed)
Subjective:    Patient ID: Kayla Church, female    DOB: Sep 07, 2001, 20 y.o.   MRN: [id number]   HPI: Kayla Church is a 20 y.o. female presenting for breast feeding problem. Left breast painful. Tried massaging and pumping without success. Pumped 15 min ago and the lump she had felt resolved and the pain started going down. No other swelling.    Denies fever, redness of nipple. Her child is now 38 month old.  Depression screen [facility] 2/9 03/08/2020 05/31/2019 06/02/2018 05/14/2018 04/27/2018  Decreased Interest 0 1 1 0 2  Down, Depressed, Hopeless 0 2 2 0 2  PHQ - 2 Score 0 3 3 0 4  Altered sleeping - 2 2 - 3  Tired, decreased energy - 1 1 - 1  Change in appetite - 2 2 - 2  Feeling bad or failure about yourself  - 0 0 - 1  Trouble concentrating - 1 0 - 0  Moving slowly or fidgety/restless - 0 0 - 2  Suicidal thoughts - 0 0 - 0  PHQ-9 Score - 9 8 - 13  Difficult doing work/chores Not difficult at all Somewhat difficult - - -  Some recent data might be hidden     Relevant past medical, surgical, family and social history reviewed and updated as indicated.  Interim medical history since our last visit reviewed. Allergies and medications reviewed and updated.  ROS:  Review of Systems  Noncontributory except as noted in HPI. Social History   Tobacco Use  Smoking Status Current Every Day Smoker  . Packs/day: 0.25  . Types: Cigarettes  Smokeless Tobacco Never Used  Tobacco Comment   pt reports 1-3 per day       Objective:     Wt Readings from Last 3 Encounters:  10/15/20 221 lb (100.2 kg) (99 %, Z= 2.22)*  07/31/20 203 lb (92.1 kg) (98 %, Z= 1.99)*  03/10/20 160 lb (72.6 kg) (89 %, Z= 1.21)*   * Growth percentiles are based on CDC (Girls, 2-20 Years) data.     Exam deferred. Pt. Harboring due to COVID 19. Phone visit performed.   Assessment & Plan:   1. Engorgement of breast associated with childbirth, postpartum    Advised patient that applying heat more frequent  pumping of small amounts when there is engorgement and for prevention making sure that she pumps the breast completely each time.  Monitor for redness and swelling with the pain and particularly a discharge that is not in keeping with the normal milk discharge.  This could be a sign of mastitis forming.     Diagnoses and all orders for this visit:  Engorgement of breast associated with childbirth, postpartum    Virtual Visit via telephone Note  I discussed the limitations, risks, security and privacy concerns of performing an evaluation and management service by telephone and the availability of in person appointments. The patient was identified with two identifiers. Pt.expressed understanding and agreed to proceed. Pt. Is at home. Dr. Darlyn Read is in his office.  Follow Up Instructions:   I discussed the assessment and treatment plan with the patient. The patient was provided an opportunity to ask questions and all were answered. The patient agreed with the plan and demonstrated an understanding of the instructions.   The patient was advised to call back or seek an in-person evaluation if the symptoms worsen or if the condition fails to improve as anticipated.   Total minutes including chart review  and phone contact time: 16   Follow up plan: Return if symptoms worsen or fail to improve.  Kayla Claude, MD [place] [facility]

## 2020-11-22 DIAGNOSIS — Z1389 Encounter for screening for other disorder: Secondary | ICD-10-CM | POA: Diagnosis not present

## 2021-04-17 ENCOUNTER — Ambulatory Visit (INDEPENDENT_AMBULATORY_CARE_PROVIDER_SITE_OTHER): Payer: Medicaid Other | Admitting: Family Medicine

## 2021-04-17 ENCOUNTER — Encounter: Payer: Self-pay | Admitting: Family Medicine

## 2021-04-17 DIAGNOSIS — R197 Diarrhea, unspecified: Secondary | ICD-10-CM | POA: Diagnosis not present

## 2021-04-17 DIAGNOSIS — Z5321 Procedure and treatment not carried out due to patient leaving prior to being seen by health care provider: Secondary | ICD-10-CM | POA: Diagnosis not present

## 2021-04-17 DIAGNOSIS — M940 Chondrocostal junction syndrome [Tietze]: Secondary | ICD-10-CM

## 2021-04-17 DIAGNOSIS — R109 Unspecified abdominal pain: Secondary | ICD-10-CM | POA: Diagnosis not present

## 2021-04-17 DIAGNOSIS — R1111 Vomiting without nausea: Secondary | ICD-10-CM | POA: Diagnosis not present

## 2021-04-17 DIAGNOSIS — K859 Acute pancreatitis without necrosis or infection, unspecified: Secondary | ICD-10-CM | POA: Diagnosis not present

## 2021-04-17 DIAGNOSIS — Z20822 Contact with and (suspected) exposure to covid-19: Secondary | ICD-10-CM | POA: Diagnosis not present

## 2021-04-17 DIAGNOSIS — Z28310 Unvaccinated for covid-19: Secondary | ICD-10-CM | POA: Diagnosis not present

## 2021-04-17 DIAGNOSIS — R1110 Vomiting, unspecified: Secondary | ICD-10-CM | POA: Diagnosis not present

## 2021-04-17 DIAGNOSIS — R1013 Epigastric pain: Secondary | ICD-10-CM | POA: Diagnosis not present

## 2021-04-17 DIAGNOSIS — D72829 Elevated white blood cell count, unspecified: Secondary | ICD-10-CM | POA: Diagnosis not present

## 2021-04-17 DIAGNOSIS — R001 Bradycardia, unspecified: Secondary | ICD-10-CM | POA: Diagnosis not present

## 2021-04-17 DIAGNOSIS — R52 Pain, unspecified: Secondary | ICD-10-CM | POA: Diagnosis not present

## 2021-04-17 DIAGNOSIS — R748 Abnormal levels of other serum enzymes: Secondary | ICD-10-CM | POA: Diagnosis not present

## 2021-04-17 DIAGNOSIS — K8500 Idiopathic acute pancreatitis without necrosis or infection: Secondary | ICD-10-CM | POA: Diagnosis not present

## 2021-04-17 DIAGNOSIS — Z87891 Personal history of nicotine dependence: Secondary | ICD-10-CM | POA: Diagnosis not present

## 2021-04-17 MED ORDER — PREDNISONE 20 MG PO TABS: 40.0000 mg | ORAL_TABLET | Freq: Every day | ORAL | 0 refills | Status: AC

## 2021-04-17 NOTE — Progress Notes (Signed)
   Virtual Visit  Note Due to COVID-19 pandemic this visit was conducted virtually. This visit type was conducted due to national recommendations for restrictions regarding the COVID-19 Pandemic (e.g. social distancing, sheltering in place) in an effort to limit this patient's exposure and mitigate transmission in our community. All issues noted in this document were discussed and addressed.  A physical exam was not performed with this format.  I connected with Kayla Church on 04/17/21 at 1516 by telephone and verified that I am speaking with the correct person using two identifiers. Kayla Church is currently located at home and her child is currently with her during the visit. The provider, Gabriel Earing, FNP is located in their office at time of visit.  I discussed the limitations, risks, security and privacy concerns of performing an evaluation and management service by telephone and the availability of in person appointments. I also discussed with the patient that there may be a patient responsible charge related to this service. The patient expressed understanding and agreed to proceed.  CC: chest wall pain  History and Present Illness:  HPI Kayla Church reports pain in the middle of chest for about 3 days. It is located under her breasts. The pain is sharp and intermittent. It occurs with a deep breath or with bending over. She denies cough, fever, congestion, sore throat, shortness of breath, or wheezing. Denies warmth or erythema.     ROS AS per HPI.   Observations/Objective: Alert and oriented x 3. Able to speak in full sentences without difficulty.   Assessment and Plan: Kayla Church was seen today for chest wall pain.  Diagnoses and all orders for this visit:  Costochondritis Prednisone as below. Discussed NSAIDs. Return to office for new or worsening symptoms, or if symptoms persist.  -     predniSONE (DELTASONE) 20 MG tablet; Take 2 tablets (40 mg total) by mouth daily with breakfast  for 3 days.    Follow Up Instructions: As needed.     I discussed the assessment and treatment plan with the patient. The patient was provided an opportunity to ask questions and all were answered. The patient agreed with the plan and demonstrated an understanding of the instructions.   The patient was advised to call back or seek an in-person evaluation if the symptoms worsen or if the condition fails to improve as anticipated.  The above assessment and management plan was discussed with the patient. The patient verbalized understanding of and has agreed to the management plan. Patient is aware to call the clinic if symptoms persist or worsen. Patient is aware when to return to the clinic for a follow-up visit. Patient educated on when it is appropriate to go to the emergency department.   Time call ended:  1528  I provided 12 minutes of  non face-to-face time during this encounter.    Gabriel Earing, FNP

## 2021-04-18 DIAGNOSIS — K760 Fatty (change of) liver, not elsewhere classified: Secondary | ICD-10-CM | POA: Diagnosis not present

## 2021-04-18 DIAGNOSIS — D72829 Elevated white blood cell count, unspecified: Secondary | ICD-10-CM | POA: Diagnosis not present

## 2021-04-18 DIAGNOSIS — R748 Abnormal levels of other serum enzymes: Secondary | ICD-10-CM | POA: Diagnosis not present

## 2021-04-18 DIAGNOSIS — K8020 Calculus of gallbladder without cholecystitis without obstruction: Secondary | ICD-10-CM | POA: Diagnosis not present

## 2021-04-18 DIAGNOSIS — R188 Other ascites: Secondary | ICD-10-CM | POA: Diagnosis not present

## 2021-04-18 DIAGNOSIS — R109 Unspecified abdominal pain: Secondary | ICD-10-CM | POA: Diagnosis not present

## 2021-04-18 DIAGNOSIS — R1110 Vomiting, unspecified: Secondary | ICD-10-CM | POA: Diagnosis not present

## 2021-04-18 DIAGNOSIS — R7401 Elevation of levels of liver transaminase levels: Secondary | ICD-10-CM | POA: Diagnosis not present

## 2021-04-18 DIAGNOSIS — K859 Acute pancreatitis without necrosis or infection, unspecified: Secondary | ICD-10-CM | POA: Diagnosis not present

## 2021-04-18 DIAGNOSIS — K828 Other specified diseases of gallbladder: Secondary | ICD-10-CM | POA: Diagnosis not present

## 2021-04-18 DIAGNOSIS — R160 Hepatomegaly, not elsewhere classified: Secondary | ICD-10-CM | POA: Diagnosis not present

## 2021-04-19 DIAGNOSIS — R7401 Elevation of levels of liver transaminase levels: Secondary | ICD-10-CM | POA: Diagnosis not present

## 2021-04-19 DIAGNOSIS — K859 Acute pancreatitis without necrosis or infection, unspecified: Secondary | ICD-10-CM | POA: Diagnosis not present

## 2021-04-20 DIAGNOSIS — K859 Acute pancreatitis without necrosis or infection, unspecified: Secondary | ICD-10-CM | POA: Diagnosis not present

## 2021-04-20 DIAGNOSIS — R7401 Elevation of levels of liver transaminase levels: Secondary | ICD-10-CM | POA: Diagnosis not present

## 2021-04-21 DIAGNOSIS — K859 Acute pancreatitis without necrosis or infection, unspecified: Secondary | ICD-10-CM | POA: Diagnosis not present

## 2021-04-21 DIAGNOSIS — R7401 Elevation of levels of liver transaminase levels: Secondary | ICD-10-CM | POA: Diagnosis not present

## 2021-04-22 DIAGNOSIS — R110 Nausea: Secondary | ICD-10-CM | POA: Diagnosis not present

## 2021-04-22 DIAGNOSIS — K859 Acute pancreatitis without necrosis or infection, unspecified: Secondary | ICD-10-CM | POA: Diagnosis not present

## 2021-04-22 DIAGNOSIS — R52 Pain, unspecified: Secondary | ICD-10-CM | POA: Diagnosis not present

## 2021-04-22 DIAGNOSIS — R7401 Elevation of levels of liver transaminase levels: Secondary | ICD-10-CM | POA: Diagnosis not present

## 2021-04-23 DIAGNOSIS — K859 Acute pancreatitis without necrosis or infection, unspecified: Secondary | ICD-10-CM | POA: Diagnosis not present

## 2021-04-24 ENCOUNTER — Telehealth: Payer: Self-pay | Admitting: Nurse Practitioner

## 2021-04-24 ENCOUNTER — Telehealth: Payer: Self-pay

## 2021-04-24 ENCOUNTER — Other Ambulatory Visit: Payer: Self-pay | Admitting: Family Medicine

## 2021-04-24 MED ORDER — ONDANSETRON 8 MG PO TBDP: 8.0000 mg | ORAL_TABLET | Freq: Four times a day (QID) | ORAL | 1 refills | Status: DC | PRN

## 2021-04-24 NOTE — Telephone Encounter (Signed)
I sent in ondansetron for her. However, she should not take it if she is still breast feeding.

## 2021-04-24 NOTE — Telephone Encounter (Signed)
PATIENT AWARE

## 2021-04-24 NOTE — Telephone Encounter (Signed)
Transition Care Management Follow-up Telephone Call Date of discharge and from where: 04/23/2021-[facility] How have you been since you were released from the hospital? Patient stated she is doing fine.  Any questions or concerns? No  Items Reviewed: Did the pt receive and understand the discharge instructions provided? Yes  Medications obtained and verified? Yes  Other? No  Any new allergies since your discharge? No  Dietary orders reviewed? N/A Do you have support at home? Yes   Home Care and Equipment/Supplies: Were home health services ordered? not applicable If so, what is the name of the agency? N/A  Has the agency set up a time to come to the patient's home? not applicable Were any new equipment or medical supplies ordered?  No What is the name of the medical supply agency? N/A Were you able to get the supplies/equipment? not applicable Do you have any questions related to the use of the equipment or supplies? No  Functional Questionnaire: (I = Independent and D = Dependent) ADLs: I  Bathing/Dressing- I  Meal Prep- I  Eating- I  Maintaining continence- I  Transferring/Ambulation- I  Managing Meds- I  Follow up appointments reviewed:  PCP Hospital f/u appt confirmed? Yes  Scheduled to see Harlow Mares, NP on 04/29/2021 @ 1:00 PM. Specialist Hospital f/u appt confirmed? No   Are transportation arrangements needed? No  If their condition worsens, is the pt aware to call PCP or go to the Emergency Dept.? Yes Was the patient provided with contact information for the PCP's office or ED? Yes Was to pt encouraged to call back with questions or concerns? Yes

## 2021-04-24 NOTE — Telephone Encounter (Signed)
Covering provider... Patient was admitted to hospital for pancreatitis, recent discharge.

## 2021-04-29 ENCOUNTER — Other Ambulatory Visit: Payer: Self-pay

## 2021-04-29 ENCOUNTER — Ambulatory Visit (INDEPENDENT_AMBULATORY_CARE_PROVIDER_SITE_OTHER): Payer: Medicaid Other | Admitting: Family Medicine

## 2021-04-29 ENCOUNTER — Encounter: Payer: Self-pay | Admitting: Family Medicine

## 2021-04-29 VITALS — BP 102/60 | HR 99 | Temp 98.8°F | Ht 62.0 in | Wt 192.0 lb

## 2021-04-29 DIAGNOSIS — K859 Acute pancreatitis without necrosis or infection, unspecified: Secondary | ICD-10-CM

## 2021-04-29 DIAGNOSIS — Z09 Encounter for follow-up examination after completed treatment for conditions other than malignant neoplasm: Secondary | ICD-10-CM

## 2021-04-29 DIAGNOSIS — K8020 Calculus of gallbladder without cholecystitis without obstruction: Secondary | ICD-10-CM

## 2021-04-29 NOTE — Patient Instructions (Signed)
Acute Pancreatitis The pancreas is a gland that is located behind the stomach on the left side of the abdomen. It produces enzymes that help to digest food. The pancreas also releases the hormones glucagon and insulin, which help to regulate blood sugar. Acute pancreatitis happens when inflammation of the pancreas suddenly occurs and the pancreas becomes irritated and swollen. Most acute attacks last a few days and cause serious problems. Some people become dehydrated and develop low blood pressure. In severe cases, bleeding in the abdomen can lead to shock and can be life-threatening. The lungs, heart, and kidneys may fail. What are the causes? This condition may be caused by: Alcohol abuse. Drug abuse. Gallstones or other conditions that can block the tube that drains the pancreas (pancreatic duct). A tumor in the pancreas. Other causes include: Certain medicines. Exposure to certain chemicals. Diabetes. An infection in the pancreas. Damage caused by an accident (trauma). The poison (venom) from a scorpion bite. Abdominal surgery. Autoimmune pancreatitis. This is when the body's disease-fighting (immune) system attacks the pancreas. Genes that are passed from parent to child (inherited). In some cases, the cause of this condition is not known. What are the signs or symptoms? Symptoms of this condition include: Pain in the upper abdomen that may radiate to the back. Pain may be severe. Tenderness and swelling of the abdomen. Nausea and vomiting. Fever. How is this diagnosed? This condition may be diagnosed based on: A physical exam. Blood tests. Imaging tests, such as X-rays, CT or MRI scans, or an ultrasound of the abdomen. How is this treated? Treatment for this condition usually requires a stay in the hospital. Treatment for this condition may include: Pain medicine. Fluid replacement through an IV. Placing a tube in the stomach to remove stomach contents and to control  vomiting (NG tube, or nasogastric tube). Not eating for 3-4 days. This gives the pancreas a rest, because enzymes are not being produced that can cause further damage. Antibiotic medicines, if your condition is caused by an infection. Treating any underlying conditions that may be the cause. Steroid medicines, if your condition is caused by your immune system attacking your body's own tissues (autoimmune disease). Surgery on the pancreas or gallbladder. Follow these instructions at home: Eating and drinking  Follow instructions from your health care provider about diet. This may involve avoiding alcohol and decreasing the amount of fat in your diet. Eat smaller, more frequent meals. This reduces the amount of digestive fluids that the pancreas produces. Drink enough fluid to keep your urine pale yellow. Do not drink alcohol if it caused your condition. General instructions Take over-the-counter and prescription medicines only as told by your health care provider. Do not drive or use heavy machinery while taking prescription pain medicine. Ask your health care provider if the medicine prescribed to you can cause constipation. You may need to take steps to prevent or treat constipation, such as: Take an over-the-counter or prescription medicine for constipation. Eat foods that are high in fiber such as whole grains and beans. Limit foods that are high in fat and processed sugars, such as fried or sweet foods. Do not use any products that contain nicotine or tobacco, such as cigarettes, e-cigarettes, and chewing tobacco. If you need help quitting, ask your health care provider. Get plenty of rest. If directed, check your blood sugar at home as told by your health care provider. Keep all follow-up visits as told by your health care provider. This is important. Contact a health care   provider if you: Do not recover as quickly as expected. Develop new or worsening symptoms. Have persistent pain,  weakness, or nausea. Recover and then have another episode of pain. Have a fever. Get help right away if: You cannot eat or keep fluids down. Your pain becomes severe. Your skin or the white part of your eyes turns yellow (jaundice). You have sudden swelling in your abdomen. You vomit. You feel dizzy or you faint. Your blood sugar is high (over 300 mg/dL). Summary Acute pancreatitis happens when inflammation of the pancreas suddenly occurs and the pancreas becomes irritated and swollen. This condition is typically caused by alcohol abuse, drug abuse, or gallstones. Treatment for this condition usually requires a stay in the hospital. This information is not intended to replace advice given to you by your health care provider. Make sure you discuss any questions you have with your health care provider. Document Revised: 06/28/2018 Document Reviewed: 03/15/2018 Elsevier Patient Education  2022 [employer]  

## 2021-04-29 NOTE — Progress Notes (Signed)
Established Patient Office Visit  Subjective:  Patient ID: Kayla Church, female    DOB: 13-Jun-2001  Age: 20 y.o. MRN: [id number]  CC:  Chief Complaint  Patient presents with   Hospitalization Follow-up    HPI Kayla Church presents for hospital follow up. She present to the ED for epigastric pain and PO intolerance on 04/17/21. She was admitted for acute pancreatitis and discharged on 04/23/21. She was referred to general surgery to discusses cholecystectomy for possible gallstone pancreatitis due to present of cholelithiasis and biliary sludge on [place] with elevated LFTs. No duct dilation was seen. LFTs were normal by discharge. WBC has remained elevated. She has an appointment with general surgery in 2 days. She is feeling better. She is having mild mid-lower mid back pain. The pain comes and goes. She also reports the nausea and vomiting has improved. It has been about 5 days since her last episode of vomiting. She is able to eat a little and staying well hydrated. She is trying to eat a low fat diet.   Past Medical History:  Diagnosis Date   ADD (attention deficit disorder)    with inadequate control   Left knee injury 4/12   bruise    Sinus arrhythmia     Past Surgical History:  Procedure Laterality Date   NO PAST SURGERIES      Family History  Problem Relation Age of Onset   Bipolar disorder Father     Social History   Socioeconomic History   Marital status: Single    Spouse name: Not on file   Number of children: Not on file   Years of education: Not on file   Highest education level: Not on file  Occupational History   Not on file  Tobacco Use   Smoking status: Every Day    Packs/day: 0.25    Types: Cigarettes   Smokeless tobacco: Never   Tobacco comments:    pt reports 1-3 per day  Vaping Use   Vaping Use: Never used  Substance and Sexual Activity   Alcohol use: No   Drug use: Yes    Types: Marijuana    Comment: pt reports she stopped mid December 2021    Sexual activity: Yes    Birth control/protection: None  Other Topics Concern   Not on file  Social History Narrative   Not on file   Social Determinants of Health   Financial Resource Strain: Not on file  Food Insecurity: Not on file  Transportation Needs: Not on file  Physical Activity: Not on file  Stress: Not on file  Social Connections: Not on file  Intimate Partner Violence: Not on file    Outpatient Medications Prior to Visit  Medication Sig Dispense Refill   acetaminophen (TYLENOL) 500 MG tablet Take by mouth.     ondansetron (ZOFRAN-ODT) 8 MG disintegrating tablet Take 1 tablet (8 mg total) by mouth every 6 (six) hours as needed for nausea or vomiting. 20 tablet 1   ibuprofen (ADVIL) 800 MG tablet Take 1 tablet (800 mg total) by mouth every 8 (eight) hours as needed. 60 tablet 1   Prenatal Vit-Fe Fumarate-FA (PRENATAL MULTIVITAMIN) TABS tablet Take 1 tablet by mouth daily at 12 noon.     No facility-administered medications prior to visit.    No Known Allergies  ROS Review of Systems As per HPI.    Objective:    Physical Exam Vitals and nursing note reviewed.  Constitutional:  General: She is not in acute distress.    Appearance: Normal appearance. She is not ill-appearing, toxic-appearing or diaphoretic.  HENT:     Mouth/Throat:     Mouth: Mucous membranes are moist.     Pharynx: Oropharynx is clear.  Cardiovascular:     Rate and Rhythm: Normal rate and regular rhythm.     Heart sounds: Normal heart sounds. No murmur heard. Pulmonary:     Effort: Pulmonary effort is normal. No respiratory distress.     Breath sounds: Normal breath sounds.  Abdominal:     General: Bowel sounds are normal. There is no distension.     Palpations: Abdomen is soft.     Tenderness: There is no abdominal tenderness. There is no right CVA tenderness, left CVA tenderness, guarding or rebound.  Musculoskeletal:     Right lower leg: No edema.     Left lower leg: No edema.   Skin:    General: Skin is warm and dry.  Neurological:     General: No focal deficit present.     Mental Status: She is alert and oriented to person, place, and time.  Psychiatric:        Mood and Affect: Mood normal.        Behavior: Behavior normal.    BP 102/60   Pulse 99   Temp 98.8 F (37.1 C) (Oral)   Ht 5\' 2"  (1.575 m)   Wt 192 lb (87.1 kg)   BMI 35.12 kg/m  Wt Readings from Last 3 Encounters:  04/29/21 192 lb (87.1 kg) (96 %, Z= 1.81)*  10/15/20 221 lb (100.2 kg) (99 %, Z= 2.22)*  07/31/20 203 lb (92.1 kg) (98 %, Z= 1.99)*   * Growth percentiles are based on CDC (Girls, 2-20 Years) data.     Health Maintenance Due  Topic Date Due   CHLAMYDIA SCREENING  02/08/2021    There are no preventive care reminders to display for this patient.  No results found for: TSH Lab Results  Component Value Date   WBC 19.1 (H) 10/16/2020   HGB 11.5 (L) 10/16/2020   HCT 34.7 (L) 10/16/2020   MCV 87.2 10/16/2020   PLT 173 10/16/2020   Lab Results  Component Value Date   NA 134 (L) 03/10/2020   K 3.7 03/10/2020   CO2 23 03/10/2020   GLUCOSE 93 03/10/2020   BUN 6 03/10/2020   CREATININE 0.64 03/10/2020   BILITOT 0.4 02/09/2020   ALKPHOS 44 02/09/2020   AST 17 02/09/2020   ALT 18 02/09/2020   PROT 6.4 (L) 02/09/2020   ALBUMIN 3.8 02/09/2020   CALCIUM 8.6 (L) 03/10/2020   ANIONGAP 6 03/10/2020   No results found for: CHOL No results found for: HDL No results found for: LDLCALC No results found for: TRIG No results found for: CHOLHDL No results found for: 03/12/2020    Assessment & Plan:   Kayla Church was seen today for hospitalization follow-up.  Diagnoses and all orders for this visit:  Acute pancreatitis, unspecified complication status, unspecified pancreatitis type Improving symptoms. Denies current vomiting. Is tolerating PO intake. Will follow up with general surgery in 2 days to discuss possibility of gallstone pancreatitis. Will recheck CBC today.  -     CBC  with Differential/Platelet  Calculus of gallbladder without cholecystitis without obstruction Will follow up with general surgery in 2 days.  -     CBC with Differential/Platelet  Hospital discharge follow-up Reviewed hospital record.  -     CBC  with Differential/Platelet    Follow-up: Return if symptoms worsen or fail to improve.   The patient indicates understanding of these issues and agrees with the plan.  Gabriel Earing, FNP

## 2021-04-30 LAB — CBC WITH DIFFERENTIAL/PLATELET
Basophils Absolute: 0.1 10*3/uL (ref 0.0–0.2)
Basos: 1 %
EOS (ABSOLUTE): 0.2 10*3/uL (ref 0.0–0.4)
Eos: 2 %
Hematocrit: 36.6 % (ref 34.0–46.6)
Hemoglobin: 11.7 g/dL (ref 11.1–15.9)
Immature Grans (Abs): 0.4 10*3/uL — ABNORMAL HIGH (ref 0.0–0.1)
Immature Granulocytes: 3 %
Lymphocytes Absolute: 0.5 10*3/uL — ABNORMAL LOW (ref 0.7–3.1)
Lymphs: 5 %
MCH: 27.8 pg (ref 26.6–33.0)
MCHC: 32 g/dL (ref 31.5–35.7)
MCV: 87 fL (ref 79–97)
Monocytes Absolute: 0.7 10*3/uL (ref 0.1–0.9)
Monocytes: 7 %
Neutrophils Absolute: 8.4 10*3/uL — ABNORMAL HIGH (ref 1.4–7.0)
Neutrophils: 82 %
Platelets: 416 10*3/uL (ref 150–450)
RBC: 4.21 x10E6/uL (ref 3.77–5.28)
RDW: 13.2 % (ref 11.7–15.4)
WBC: 10.3 10*3/uL (ref 3.4–10.8)

## 2021-05-02 DIAGNOSIS — K851 Biliary acute pancreatitis without necrosis or infection: Secondary | ICD-10-CM | POA: Diagnosis not present

## 2021-05-02 DIAGNOSIS — K8070 Calculus of gallbladder and bile duct without cholecystitis without obstruction: Secondary | ICD-10-CM | POA: Diagnosis not present

## 2021-06-14 DIAGNOSIS — K8010 Calculus of gallbladder with chronic cholecystitis without obstruction: Secondary | ICD-10-CM | POA: Diagnosis not present

## 2021-06-14 DIAGNOSIS — K859 Acute pancreatitis without necrosis or infection, unspecified: Secondary | ICD-10-CM | POA: Diagnosis not present

## 2021-06-14 DIAGNOSIS — K8020 Calculus of gallbladder without cholecystitis without obstruction: Secondary | ICD-10-CM | POA: Diagnosis not present

## 2021-06-14 DIAGNOSIS — K851 Biliary acute pancreatitis without necrosis or infection: Secondary | ICD-10-CM | POA: Diagnosis not present

## 2021-06-17 ENCOUNTER — Telehealth: Payer: Self-pay | Admitting: Nurse Practitioner

## 2021-06-17 NOTE — Telephone Encounter (Signed)
I attempted calling the patient and the patients mother. Neither line answered.  Patients mailbox is not setup and patients mother has a mailbox that is full

## 2021-06-17 NOTE — Telephone Encounter (Signed)
Appointment scheduled for tomorrow with Kari Baars, FNP

## 2021-06-18 ENCOUNTER — Ambulatory Visit (INDEPENDENT_AMBULATORY_CARE_PROVIDER_SITE_OTHER): Payer: Medicaid Other | Admitting: Family Medicine

## 2021-06-18 ENCOUNTER — Other Ambulatory Visit: Payer: Self-pay

## 2021-06-18 ENCOUNTER — Encounter: Payer: Self-pay | Admitting: Family Medicine

## 2021-06-18 VITALS — BP 110/68 | HR 67 | Temp 97.4°F | Ht 62.0 in | Wt 180.0 lb

## 2021-06-18 DIAGNOSIS — Z9889 Other specified postprocedural states: Secondary | ICD-10-CM

## 2021-06-18 DIAGNOSIS — T148XXA Other injury of unspecified body region, initial encounter: Secondary | ICD-10-CM

## 2021-06-18 DIAGNOSIS — S3092XA Unspecified superficial injury of abdominal wall, initial encounter: Secondary | ICD-10-CM

## 2021-06-18 NOTE — Progress Notes (Signed)
Subjective:  Patient ID: Kayla Church, female    DOB: May 16, 2001, 20 y.o.   MRN: [id number]  Patient Care Team: Bennie Pierini, FNP as PCP - General (Family Medicine)   Chief Complaint:  surgical site redness, warmth   HPI: Kayla Church is a 20 y.o. female presenting on 06/18/2021 for surgical site redness, warmth   Pt presents today for evaluation of her surgical wounds from laparoscopic cholecystectomy on 06/14/2021. She states one of the wounds was red and slightly swollen yesterday but not today. She denies fever, chills, weakness, confusion, or any drainage from wounds.   There are no diagnoses linked to this encounter.    Relevant past medical, surgical, family, and social history reviewed and updated as indicated.  Allergies and medications reviewed and updated. Data reviewed: Chart in Epic.   Past Medical History:  Diagnosis Date   ADD (attention deficit disorder)    with inadequate control   Left knee injury 4/12   bruise    Sinus arrhythmia     Past Surgical History:  Procedure Laterality Date   NO PAST SURGERIES      Social History   Socioeconomic History   Marital status: Single    Spouse name: Not on file   Number of children: Not on file   Years of education: Not on file   Highest education level: Not on file  Occupational History   Not on file  Tobacco Use   Smoking status: Every Day    Packs/day: 0.25    Types: Cigarettes   Smokeless tobacco: Never   Tobacco comments:    pt reports 1-3 per day  Vaping Use   Vaping Use: Never used  Substance and Sexual Activity   Alcohol use: No   Drug use: Yes    Types: Marijuana    Comment: pt reports she stopped mid December 2021   Sexual activity: Yes    Birth control/protection: None  Other Topics Concern   Not on file  Social History Narrative   Not on file   Social Determinants of Health   Financial Resource Strain: Not on file  Food Insecurity: Not on file  Transportation  Needs: Not on file  Physical Activity: Not on file  Stress: Not on file  Social Connections: Not on file  Intimate Partner Violence: Not on file    Outpatient Encounter Medications as of 06/18/2021  Medication Sig   acetaminophen (TYLENOL) 500 MG tablet Take by mouth.   ondansetron (ZOFRAN-ODT) 8 MG disintegrating tablet Take 1 tablet (8 mg total) by mouth every 6 (six) hours as needed for nausea or vomiting.   oxyCODONE-acetaminophen (PERCOCET/ROXICET) 5-325 MG tablet Take by mouth every 4 (four) hours as needed for severe pain.   No facility-administered encounter medications on file as of 06/18/2021.    No Known Allergies  Review of Systems  Constitutional:  Negative for activity change, appetite change, chills, fatigue and fever.  HENT: Negative.    Eyes: Negative.   Respiratory:  Negative for cough, chest tightness and shortness of breath.   Cardiovascular:  Negative for chest pain, palpitations and leg swelling.  Gastrointestinal:  Negative for abdominal pain, blood in stool, constipation, diarrhea, nausea and vomiting.  Endocrine: Negative.   Genitourinary:  Negative for decreased urine volume, dysuria, frequency and urgency.  Musculoskeletal:  Negative for arthralgias and myalgias.  Skin:  Positive for color change and wound.  Allergic/Immunologic: Negative.   Neurological:  Negative for dizziness, weakness and headaches.  Hematological: Negative.   Psychiatric/Behavioral:  Negative for confusion, hallucinations, sleep disturbance and suicidal ideas.   All other systems reviewed and are negative.      Objective:  BP 110/68   Pulse 67   Temp (!) 97.4 F (36.3 C)   Ht 5\' 2"  (1.575 m)   Wt 180 lb (81.6 kg)   LMP 06/04/2021 (Approximate)   SpO2 97%   BMI 32.92 kg/m    Wt Readings from Last 3 Encounters:  06/18/21 180 lb (81.6 kg)  04/29/21 192 lb (87.1 kg) (96 %, Z= 1.81)*  10/15/20 221 lb (100.2 kg) (99 %, Z= 2.22)*   * Growth percentiles are based on CDC  (Girls, 2-20 Years) data.    Physical Exam Vitals and nursing note reviewed.  Constitutional:      General: She is not in acute distress.    Appearance: Normal appearance. She is well-developed and well-groomed. She is obese. She is not ill-appearing, toxic-appearing or diaphoretic.  HENT:     Head: Normocephalic and atraumatic.     Jaw: There is normal jaw occlusion.     Right Ear: Hearing normal.     Left Ear: Hearing normal.     Nose: Nose normal.     Mouth/Throat:     Lips: Pink.     Mouth: Mucous membranes are moist.     Pharynx: Oropharynx is clear. Uvula midline.  Eyes:     General: Lids are normal.     Extraocular Movements: Extraocular movements intact.     Conjunctiva/sclera: Conjunctivae normal.     Pupils: Pupils are equal, round, and reactive to light.  Neck:     Thyroid: No thyroid mass, thyromegaly or thyroid tenderness.     Vascular: No carotid bruit or JVD.     Trachea: Trachea and phonation normal.  Cardiovascular:     Rate and Rhythm: Normal rate and regular rhythm.     Chest Wall: PMI is not displaced.     Pulses: Normal pulses.     Heart sounds: Normal heart sounds. No murmur heard.   No friction rub. No gallop.  Pulmonary:     Effort: Pulmonary effort is normal. No respiratory distress.     Breath sounds: Normal breath sounds. No wheezing.  Abdominal:     General: A surgical scar is present. Bowel sounds are normal. There is no distension or abdominal bruit.     Palpations: Abdomen is soft. There is no hepatomegaly or splenomegaly.     Tenderness: There is no abdominal tenderness. There is no right CVA tenderness or left CVA tenderness.     Hernia: No hernia is present.    Musculoskeletal:        General: Normal range of motion.     Cervical back: Normal range of motion and neck supple.     Right lower leg: No edema.     Left lower leg: No edema.  Lymphadenopathy:     Cervical: No cervical adenopathy.  Skin:    General: Skin is warm and dry.      Capillary Refill: Capillary refill takes less than 2 seconds.     Coloration: Skin is not cyanotic, jaundiced or pale.     Findings: Wound (healing surgical wounds to abdomen) present. No abscess, ecchymosis, erythema or rash.  Neurological:     General: No focal deficit present.     Mental Status: She is alert and oriented to person, place, and time.     Cranial Nerves: Cranial nerves  are intact.     Sensory: Sensation is intact.     Motor: Motor function is intact.     Coordination: Coordination is intact.     Gait: Gait is intact.     Deep Tendon Reflexes: Reflexes are normal and symmetric.  Psychiatric:        Attention and Perception: Attention and perception normal.        Mood and Affect: Mood and affect normal.        Speech: Speech normal.        Behavior: Behavior normal. Behavior is cooperative.        Thought Content: Thought content normal.        Cognition and Memory: Cognition and memory normal.        Judgment: Judgment normal.    Results for orders placed or performed in visit on 04/29/21  CBC with Differential/Platelet  Result Value Ref Range   WBC 10.3 3.4 - 10.8 x10E3/uL   RBC 4.21 3.77 - 5.28 x10E6/uL   Hemoglobin 11.7 11.1 - 15.9 g/dL   Hematocrit 41.7 40.8 - 46.6 %   MCV 87 79 - 97 fL   MCH 27.8 26.6 - 33.0 pg   MCHC 32.0 31.5 - 35.7 g/dL   RDW 14.4 81.8 - 56.3 %   Platelets 416 150 - 450 x10E3/uL   Neutrophils 82 Not Estab. %   Lymphs 5 Not Estab. %   Monocytes 7 Not Estab. %   Eos 2 Not Estab. %   Basos 1 Not Estab. %   Neutrophils Absolute 8.4 (H) 1.4 - 7.0 x10E3/uL   Lymphocytes Absolute 0.5 (L) 0.7 - 3.1 x10E3/uL   Monocytes Absolute 0.7 0.1 - 0.9 x10E3/uL   EOS (ABSOLUTE) 0.2 0.0 - 0.4 x10E3/uL   Basophils Absolute 0.1 0.0 - 0.2 x10E3/uL   Immature Granulocytes 3 Not Estab. %   Immature Grans (Abs) 0.4 (H) 0.0 - 0.1 x10E3/uL       Pertinent labs & imaging results that were available during my care of the patient were reviewed by me and  considered in my medical decision making.  Assessment & Plan:  Aurelia was seen today for surgical site redness, warmth.  Diagnoses and all orders for this visit:  Post-operative state Surgical wound present S/P laparoscopic cholecystectomy 06/14/2021. Surgical wounds healing well. No s/s of infection. Follow up with surgeon as scheduled.   Continue all other maintenance medications.  Follow up plan: Return if symptoms worsen or fail to improve.   Continue healthy lifestyle choices, including diet (rich in fruits, vegetables, and lean proteins, and low in salt and simple carbohydrates) and exercise (at least 30 minutes of moderate physical activity daily).  Educational handout given for wound care  The above assessment and management plan was discussed with the patient. The patient verbalized understanding of and has agreed to the management plan. Patient is aware to call the clinic if they develop any new symptoms or if symptoms persist or worsen. Patient is aware when to return to the clinic for a follow-up visit. Patient educated on when it is appropriate to go to the emergency department.   Kari Baars, FNP-C [facility] [phone]

## 2021-06-28 DIAGNOSIS — Z48815 Encounter for surgical aftercare following surgery on the digestive system: Secondary | ICD-10-CM | POA: Diagnosis not present

## 2021-06-28 DIAGNOSIS — Z79899 Other long term (current) drug therapy: Secondary | ICD-10-CM | POA: Diagnosis not present

## 2021-07-09 ENCOUNTER — Encounter: Payer: Self-pay | Admitting: Nurse Practitioner

## 2021-07-09 ENCOUNTER — Ambulatory Visit (INDEPENDENT_AMBULATORY_CARE_PROVIDER_SITE_OTHER): Payer: Medicaid Other | Admitting: Nurse Practitioner

## 2021-07-09 ENCOUNTER — Other Ambulatory Visit: Payer: Self-pay

## 2021-07-09 VITALS — BP 118/69 | HR 87 | Temp 98.2°F | Resp 20 | Ht 62.0 in | Wt 183.0 lb

## 2021-07-09 DIAGNOSIS — Z189 Retained foreign body fragments, unspecified material: Secondary | ICD-10-CM | POA: Diagnosis not present

## 2021-07-09 DIAGNOSIS — T8189XA Other complications of procedures, not elsewhere classified, initial encounter: Secondary | ICD-10-CM | POA: Diagnosis not present

## 2021-07-09 NOTE — Progress Notes (Signed)
   Subjective:    Patient ID: Kayla Church, female    DOB: 12/01/2000, 20 y.o.   MRN: [id number]   Chief Complaint: Belly button red  (Had gallbladder removed at end of September)   HPI Patient had her gallbladder removed 06/14/21. She says belly button is red and has a stitch coming out of it.     Review of Systems  Constitutional:  Negative for diaphoresis.  Eyes:  Negative for pain.  Respiratory:  Negative for shortness of breath.   Cardiovascular:  Negative for chest pain, palpitations and leg swelling.  Gastrointestinal:  Negative for abdominal pain.  Endocrine: Negative for polydipsia.  Skin:  Negative for rash.  Neurological:  Negative for dizziness, weakness and headaches.  Hematological:  Does not bruise/bleed easily.  All other systems reviewed and are negative.     Objective:   Physical Exam Vitals and nursing note reviewed.  Constitutional:      Appearance: Normal appearance.  Cardiovascular:     Rate and Rhythm: Normal rate and regular rhythm.     Heart sounds: Normal heart sounds.  Pulmonary:     Breath sounds: Normal breath sounds.  Skin:    General: Skin is warm.  Neurological:     General: No focal deficit present.     Mental Status: She is alert and oriented to person, place, and time.   Removal of retained stitch in umbilicus- no drainage or infection       Assessment & Plan:   Kayla Church in today with chief complaint of Belly button red  (Had gallbladder removed at end of September)   1. Retained suture, initial encounter Clean daily with antibacterial soap Do not pick or scratch at area Triple antibiotic ointment Call if starts draining or smells bad    The above assessment and management plan was discussed with the patient. The patient verbalized understanding of and has agreed to the management plan. Patient is aware to call the clinic if symptoms persist or worsen. Patient is aware when to return to the clinic for a follow-up  visit. Patient educated on when it is appropriate to go to the emergency department.   Mary-Margaret Daphine Deutscher, FNP

## 2021-07-12 ENCOUNTER — Telehealth: Payer: Self-pay | Admitting: Nurse Practitioner

## 2021-07-12 MED ORDER — SULFAMETHOXAZOLE-TRIMETHOPRIM 800-160 MG PO TABS: 1.0000 | ORAL_TABLET | Freq: Two times a day (BID) | ORAL | 0 refills | Status: DC

## 2021-07-12 NOTE — Telephone Encounter (Signed)
Pt aware.

## 2021-07-12 NOTE — Telephone Encounter (Signed)
Meds ordered this encounter  Medications   sulfamethoxazole-trimethoprim (BACTRIM DS) 800-160 MG tablet    Sig: Take 1 tablet by mouth 2 (two) times daily.    Dispense:  20 tablet    Refill:  0    Order Specific Question:   Supervising Provider    Answer:   Arville Care A [id number]

## 2021-08-09 ENCOUNTER — Other Ambulatory Visit: Payer: Self-pay

## 2021-08-09 ENCOUNTER — Ambulatory Visit (INDEPENDENT_AMBULATORY_CARE_PROVIDER_SITE_OTHER): Payer: Medicaid Other

## 2021-08-09 ENCOUNTER — Ambulatory Visit (INDEPENDENT_AMBULATORY_CARE_PROVIDER_SITE_OTHER): Payer: Medicaid Other | Admitting: Nurse Practitioner

## 2021-08-09 ENCOUNTER — Encounter: Payer: Self-pay | Admitting: Nurse Practitioner

## 2021-08-09 VITALS — BP 133/75 | HR 86 | Temp 98.0°F | Resp 20 | Ht 62.0 in | Wt 182.0 lb

## 2021-08-09 DIAGNOSIS — R109 Unspecified abdominal pain: Secondary | ICD-10-CM

## 2021-08-09 NOTE — Progress Notes (Signed)
   Subjective:    Patient ID: Kayla Church, female    DOB: 27-May-2001, 20 y.o.   MRN: [id number]   Chief Complaint: stomach cramps and Diarrhea   HPI Patient comes in today c/o abdominal cramping and diarrhea. When she has to have a bowel movement she cannot hold it. She had her gall bladder out a few months ago.     Review of Systems  Constitutional:  Negative for diaphoresis.  Eyes:  Negative for pain.  Respiratory:  Negative for shortness of breath.   Cardiovascular:  Negative for chest pain, palpitations and leg swelling.  Gastrointestinal:  Negative for abdominal pain.  Endocrine: Negative for polydipsia.  Skin:  Negative for rash.  Neurological:  Negative for dizziness, weakness and headaches.  Hematological:  Does not bruise/bleed easily.  All other systems reviewed and are negative.     Objective:   Physical Exam Vitals and nursing note reviewed.  Constitutional:      Appearance: Normal appearance.  Cardiovascular:     Rate and Rhythm: Normal rate and regular rhythm.     Heart sounds: Normal heart sounds.  Pulmonary:     Effort: Pulmonary effort is normal.     Breath sounds: Normal breath sounds.  Abdominal:     General: Abdomen is flat. Bowel sounds are normal. There is no distension.     Palpations: Abdomen is soft. There is no mass.     Tenderness: There is abdominal tenderness (mild right lower quadrant tnederness).     Hernia: No hernia is present.  Neurological:     Mental Status: She is alert.   BP 133/75   Pulse 86   Temp 98 F (36.7 C) (Temporal)   Resp 20   Ht 5\' 2"  (1.575 m)   Wt 182 lb (82.6 kg)   SpO2 92%   BMI 33.29 kg/m   KUB- mild stool burden on right-Preliminary reading by , FNP  [facility]        Assessment & Plan:  Kayla Church in today with chief complaint of stomach cramps and Diarrhea   1. Abdominal cramping Increase fiber in diet Avoid spicy and fatty foods  - DG Abd 1 View    The above assessment and  management plan was discussed with the patient. The patient verbalized understanding of and has agreed to the management plan. Patient is aware to call the clinic if symptoms persist or worsen. Patient is aware when to return to the clinic for a follow-up visit. Patient educated on when it is appropriate to go to the emergency department.   Mary-Margaret [facility], FNP

## 2021-10-11 DIAGNOSIS — J029 Acute pharyngitis, unspecified: Secondary | ICD-10-CM | POA: Diagnosis not present

## 2021-10-11 DIAGNOSIS — J329 Chronic sinusitis, unspecified: Secondary | ICD-10-CM | POA: Diagnosis not present

## 2021-10-11 DIAGNOSIS — R0981 Nasal congestion: Secondary | ICD-10-CM | POA: Diagnosis not present

## 2021-10-11 DIAGNOSIS — R059 Cough, unspecified: Secondary | ICD-10-CM | POA: Diagnosis not present

## 2021-11-27 DIAGNOSIS — Z130 Encounter for screening for diseases of the blood and blood-forming organs and certain disorders involving the immune mechanism: Secondary | ICD-10-CM | POA: Diagnosis not present

## 2021-11-27 DIAGNOSIS — Z113 Encounter for screening for infections with a predominantly sexual mode of transmission: Secondary | ICD-10-CM | POA: Diagnosis not present

## 2021-11-27 DIAGNOSIS — Z0000 Encounter for general adult medical examination without abnormal findings: Secondary | ICD-10-CM | POA: Diagnosis not present

## 2021-11-27 DIAGNOSIS — Z30011 Encounter for initial prescription of contraceptive pills: Secondary | ICD-10-CM | POA: Diagnosis not present

## 2021-11-27 DIAGNOSIS — Z1389 Encounter for screening for other disorder: Secondary | ICD-10-CM | POA: Diagnosis not present

## 2021-11-28 ENCOUNTER — Ambulatory Visit (INDEPENDENT_AMBULATORY_CARE_PROVIDER_SITE_OTHER): Payer: Medicaid Other | Admitting: Family Medicine

## 2021-11-28 ENCOUNTER — Encounter: Payer: Self-pay | Admitting: Family Medicine

## 2021-11-28 VITALS — BP 127/73 | HR 103 | Temp 97.0°F | Ht 62.0 in | Wt 183.0 lb

## 2021-11-28 DIAGNOSIS — L309 Dermatitis, unspecified: Secondary | ICD-10-CM

## 2021-11-28 MED ORDER — TRIAMCINOLONE ACETONIDE 0.1 % EX CREA: 1.0000 "application " | TOPICAL_CREAM | Freq: Two times a day (BID) | CUTANEOUS | 1 refills | Status: DC

## 2021-11-28 NOTE — Progress Notes (Signed)
? ?  Assessment & Plan:  ?1. Eczema of both upper extremities ?Education provided on eczema. Started triamcinolone cream.  ?- triamcinolone cream (KENALOG) 0.1 %; Apply 1 application. topically 2 (two) times daily.  Dispense: 80 g; Refill: 1 ? ? ?Follow up plan: Return if symptoms worsen or fail to improve. ? ?Deliah Boston, MSN, APRN, FNP-C ?[facility] ? ?Subjective:  ? ?Patient ID: Kayla Church, female    DOB: 29-Sep-2000, 21 y.o.   MRN: [id number] ? ?HPI: ?Kayla Church is a 21 y.o. female presenting on 11/28/2021 for Rash (Patient states she has spots on bilateral arms that she is concerned might be ring worm.  First spot came up a few month ago. Itches) ? ?Patient reports spots on the upper part of both arms that are itchy. She first noticed them a few months ago. She is wanting to make sure it isn't something she can spread to her daughter. ? ? ?ROS: Negative unless specifically indicated above in HPI.  ? ?Relevant past medical history reviewed and updated as indicated.  ? ?Allergies and medications reviewed and updated. ? ?No current outpatient medications on file. ? ?No Known Allergies ? ?Objective:  ? ?BP 127/73   Pulse (!) 103   Temp (!) 97 ?F (36.1 ?C) (Temporal)   Ht 5\' 2"  (1.575 m)   Wt 183 lb (83 kg)   BMI 33.47 kg/m?   ? ?Physical Exam ?Vitals reviewed.  ?Constitutional:   ?   General: She is not in acute distress. ?   Appearance: Normal appearance. She is not ill-appearing, toxic-appearing or diaphoretic.  ?HENT:  ?   Head: Normocephalic and atraumatic.  ?Eyes:  ?   General: No scleral icterus.    ?   Right eye: No discharge.     ?   Left eye: No discharge.  ?   Conjunctiva/sclera: Conjunctivae normal.  ?Cardiovascular:  ?   Rate and Rhythm: Normal rate.  ?Pulmonary:  ?   Effort: Pulmonary effort is normal. No respiratory distress.  ?Musculoskeletal:     ?   General: Normal range of motion.  ?   Cervical back: Normal range of motion.  ?Skin: ?   General: Skin is warm and dry.   ?   Capillary Refill: Capillary refill takes less than 2 seconds.  ?   Comments: Eczema patches to both upper arms.  ?Neurological:  ?   General: No focal deficit present.  ?   Mental Status: She is alert and oriented to person, place, and time. Mental status is at baseline.  ?Psychiatric:     ?   Mood and Affect: Mood normal.     ?   Behavior: Behavior normal.     ?   Thought Content: Thought content normal.     ?   Judgment: Judgment normal.  ? ? ? ? ? ? ?

## 2022-01-14 ENCOUNTER — Encounter: Payer: Self-pay | Admitting: Nurse Practitioner

## 2022-01-14 ENCOUNTER — Ambulatory Visit (INDEPENDENT_AMBULATORY_CARE_PROVIDER_SITE_OTHER): Payer: Medicaid Other | Admitting: Nurse Practitioner

## 2022-01-14 VITALS — BP 124/70 | HR 88 | Temp 98.7°F | Resp 20 | Ht 62.0 in | Wt 186.0 lb

## 2022-01-14 DIAGNOSIS — G44219 Episodic tension-type headache, not intractable: Secondary | ICD-10-CM | POA: Diagnosis not present

## 2022-01-14 MED ORDER — NAPROXEN 500 MG PO TABS: 500.0000 mg | ORAL_TABLET | Freq: Two times a day (BID) | ORAL | 1 refills | Status: DC

## 2022-01-14 NOTE — Progress Notes (Signed)
? ?  Subjective:  ? ? Patient ID: Kayla Church, female    DOB: [birth year], 21 y.o.   MRN: [id number] ? ? ?Chief Complaint: Headache (Daily/) ? ? ?Headache  ?Pertinent negatives include no abdominal pain, dizziness, eye pain or weakness.  ?Patient comes in c/o headaches. She says she is having them daily for several months. Rates pain 10/10 at times. They last several hours. Pain is usually across hr forehead. Nothing helps. She has tried motrin and tylenol, no relief. Then she tried [facility] and goody powders with no relief. Has occasional nausea, but denies blurred vision. She drinks Mt Dew daily 2-3 a day. ? ? ? ?Review of Systems  ?Constitutional:  Negative for diaphoresis.  ?Eyes:  Negative for pain.  ?Respiratory:  Negative for shortness of breath.   ?Cardiovascular:  Negative for chest pain, palpitations and leg swelling.  ?Gastrointestinal:  Negative for abdominal pain.  ?Endocrine: Negative for polydipsia.  ?Skin:  Negative for rash.  ?Neurological:  Positive for headaches. Negative for dizziness and weakness.  ?Hematological:  Does not bruise/bleed easily.  ?All other systems reviewed and are negative. ? ?   ?Objective:  ? Physical Exam ?Vitals reviewed.  ?Constitutional:   ?   Appearance: She is well-developed.  ?Eyes:  ?   Extraocular Movements: Extraocular movements intact.  ?   Pupils: Pupils are equal, round, and reactive to light.  ?Cardiovascular:  ?   Rate and Rhythm: Normal rate and regular rhythm.  ?Pulmonary:  ?   Effort: Pulmonary effort is normal.  ?   Breath sounds: Normal breath sounds.  ?Abdominal:  ?   General: Bowel sounds are normal.  ?Musculoskeletal:     ?   General: Normal range of motion.  ?   Cervical back: Normal range of motion and neck supple.  ?Skin: ?   General: Skin is warm.  ?Neurological:  ?   Mental Status: She is alert.  ? ?BP 124/70   Pulse 88   Temp 98.7 ?F (37.1 ?C) (Temporal)   Resp 20   Ht 5\' 2"  (1.575 m)   Wt 186 lb (84.4 kg)   SpO2 98%   BMI 34.02 kg/m?  ? ? ? ? ?    ?Assessment & Plan:  ? ?Kayla Church in today with chief complaint of Headache (Daily/) ? ? ?1. Episodic tension-type headache, not intractable ?Avoid caffeine ?No BC or goodys ?Force fluids ?Rest ? ?Meds ordered this encounter  ?Medications  ? naproxen (NAPROSYN) 500 MG tablet  ?  Sig: Take 1 tablet (500 mg total) by mouth 2 (two) times daily with a meal.  ?  Dispense:  60 tablet  ?  Refill:  1  ?  Order Specific Question:   Supervising Provider  ?  Answer:   A [[id number]]  ? ? ? ? ? ?The above assessment and management plan was discussed with the patient. The patient verbalized understanding of and has agreed to the management plan. Patient is aware to call the clinic if symptoms persist or worsen. Patient is aware when to return to the clinic for a follow-up visit. Patient educated on when it is appropriate to go to the emergency department.  ? ?Mary-Margaret [facility], FNP ? ? ?

## 2022-01-14 NOTE — Patient Instructions (Signed)
General Headache Without Cause A headache is pain or discomfort you feel around the head or neck area. There are many causes and types of headaches. In some cases, the cause may not be found. Follow these instructions at home: Watch your condition for any changes. Let your doctor know about them. Take these steps to help with your condition: Managing pain     Take over-the-counter and prescription medicines only as told by your doctor. This includes medicines for pain that are taken by mouth or put on the skin. Lie down in a dark, quiet room when you have a headache. If told, put ice on your head and neck area: Put ice in a plastic bag. Place a towel between your skin and the bag. Leave the ice on for 20 minutes, 2-3 times per day. Take off the ice if your skin turns bright red. This is very important. If you cannot feel pain, heat, or cold, you have a greater risk of damage to the area. If told, put heat on the affected area. Use the heat source that your doctor recommends, such as a moist heat pack or a heating pad. Place a towel between your skin and the heat source. Leave the heat on for 20-30 minutes. Take off the heat if your skin turns bright red. This is very important. If you cannot feel pain, heat, or cold, you have a greater risk of getting burned. Keep lights dim if bright lights bother you or make your headaches worse. Eating and drinking Eat meals on a regular schedule. If you drink alcohol: Limit how much you have to: 0-1 drink a day for women who are not pregnant. 0-2 drinks a day for men. Know how much alcohol is in a drink. In the [place] one drink equals one 12 oz bottle of beer (355 mL), one 5 oz glass of wine (148 mL), or one 1 oz glass of hard liquor (44 mL). Stop drinking caffeine, or drink less caffeine. General instructions  Keep a journal to find out if certain things bring on headaches. For example, write down: What you eat and drink. How much sleep you  get. Any change to your diet or medicines. Get a massage or try other ways to relax. Limit stress. Sit up straight. Do not tighten (tense) your muscles. Do not smoke or use any products that contain nicotine or tobacco. If you need help quitting, ask your doctor. Exercise regularly as told by your doctor. Get enough sleep. This often means 7-9 hours of sleep each night. Keep all follow-up visits. This is important. Contact a doctor if: Medicine does not help your symptoms. You have a headache that feels different than the other headaches. You feel like you may vomit (nauseous) or you vomit. You have a fever. Get help right away if: Your headache: Gets very bad quickly. Gets worse after a lot of physical activity. You have any of these symptoms: You continue to vomit. A stiff neck. Trouble seeing. Your eye or ear hurts. Trouble speaking. Weak muscles or you lose muscle control. You lose your balance or have trouble walking. You feel like you will pass out (faint) or you pass out. You are mixed up (confused). You have a seizure. These symptoms may be an emergency. Get help right away. Call your local emergency services (911 in the [place] Do not wait to see if the symptoms will go away. Do not drive yourself to the hospital. Summary A headache is pain or discomfort that   is felt around the head or neck area. There are many causes and types of headaches. In some cases, the cause may not be found. Keep a journal to help find out what causes your headaches. Watch your condition for any changes. Let your doctor know about them. Contact a doctor if you have a headache that is different from usual, or if medicine does not help your headache. Get help right away if your headache gets very bad, you throw up, you have trouble seeing, you lose your balance, or you have a seizure. This information is not intended to replace advice given to you by your health care provider. Make sure you  discuss any questions you have with your health care provider. Document Revised: 02/06/2021 Document Reviewed: 02/06/2021 Elsevier Patient Education  2023 [employer]  

## 2022-03-04 IMAGING — US US OB < 14 WEEKS - US OB TV
1 series · 15 of 28 positions shown · non-contrast
Comparison: None

CLINICAL DATA: Abdominal pain in first trimester of pregnancy; LMP
01/02/2020; quantitative beta HCG = 355 today

EXAM:
OBSTETRIC <14 WK US AND TRANSVAGINAL OB US
TECHNIQUE: Both transabdominal and transvaginal ultrasound examinations were
performed for complete evaluation of the gestation as well as the
maternal uterus, adnexal regions, and pelvic cul-de-sac.
Transvaginal technique was performed to assess early pregnancy.

[Series 1: us ob < 14 weeks - us ob tv · 15 of 85 slices shown]
[im 1/85]
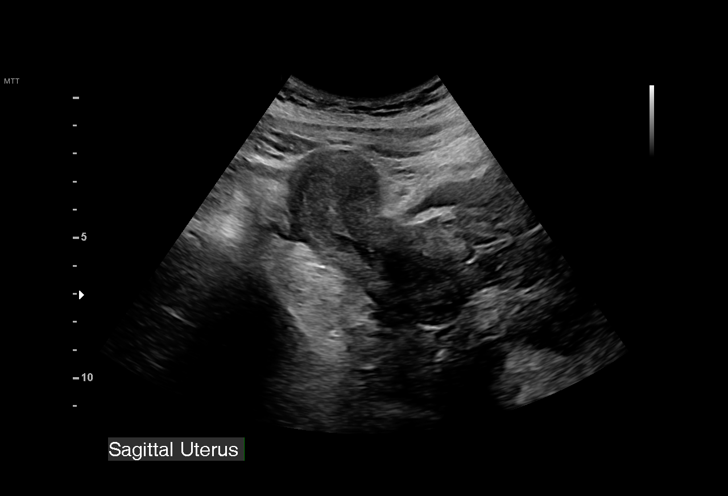
[im 7/85]
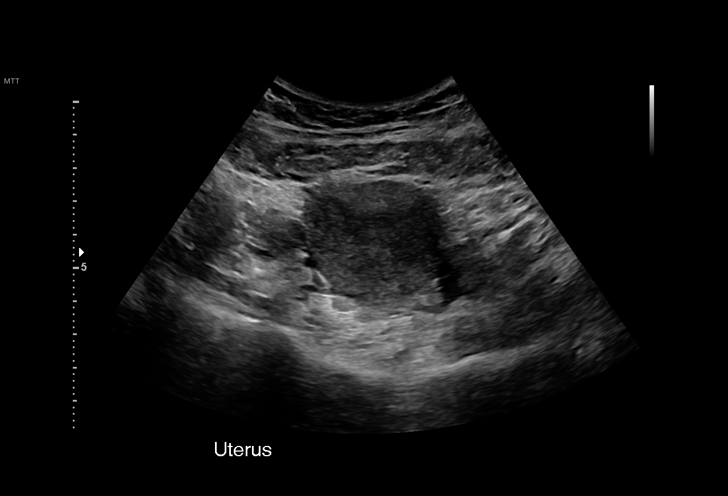
[im 13/85]
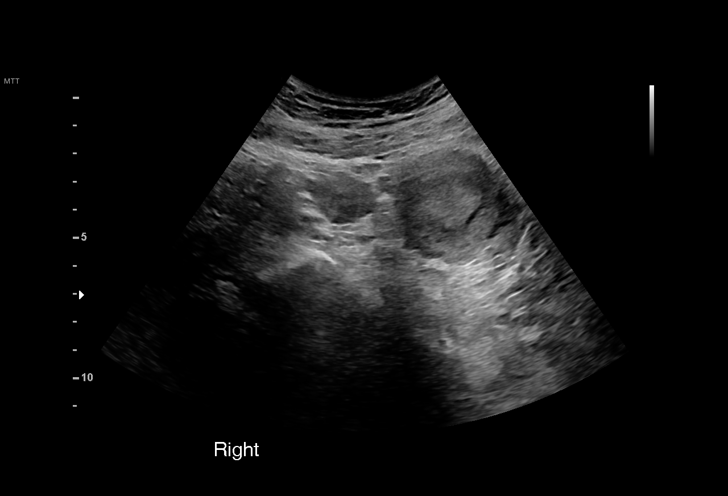
[im 19/85]
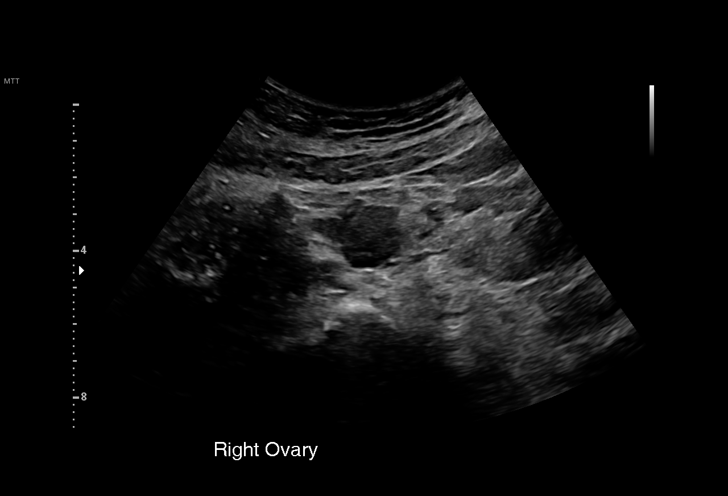
[im 25/85]
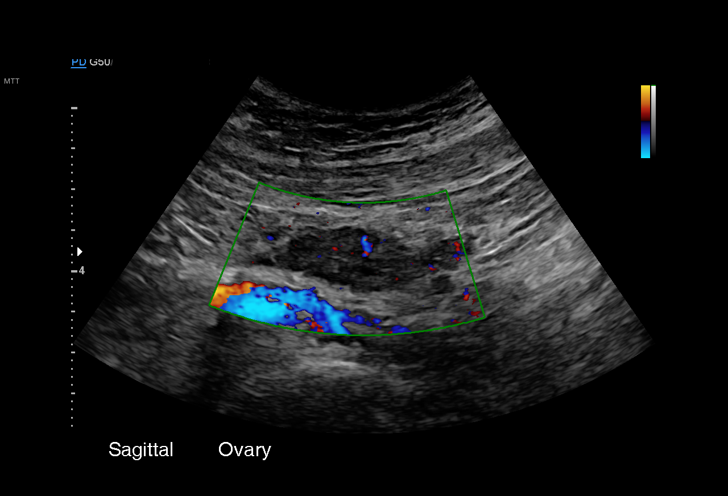
[im 32/85]
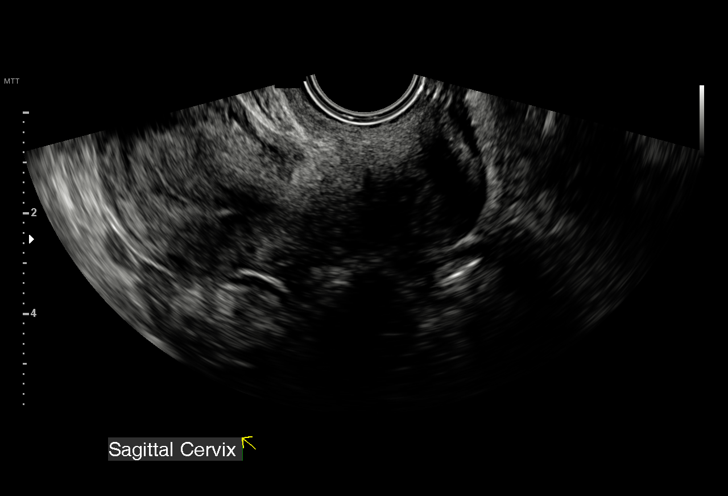
[im 38/85]
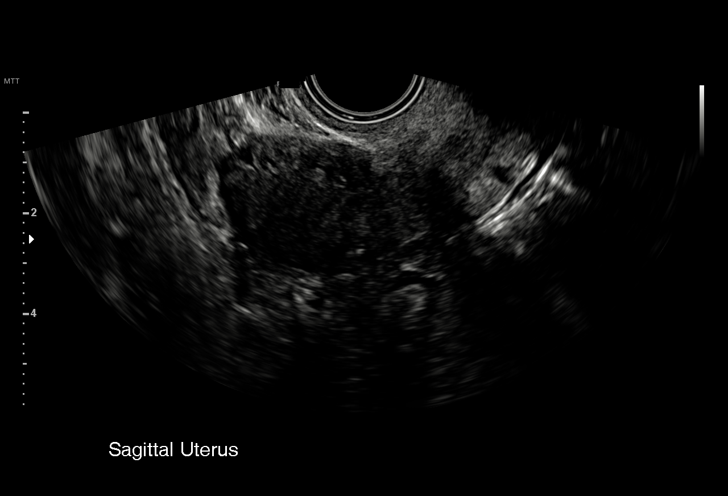
[im 44/85]
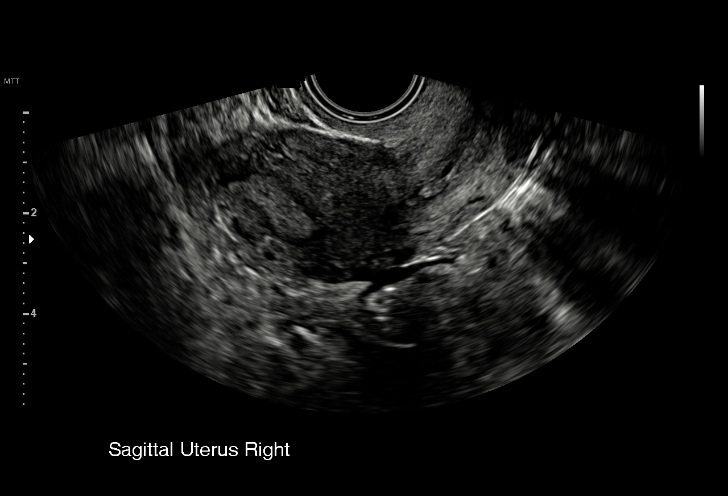
[im 47/85]
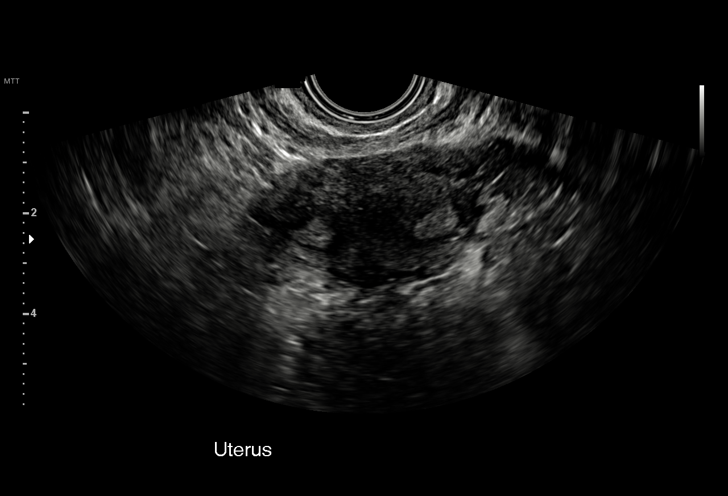
[im 53/85]
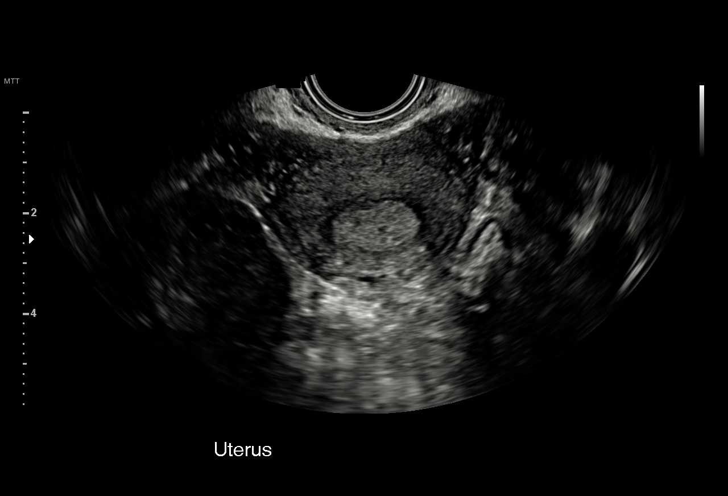
[im 60/85]
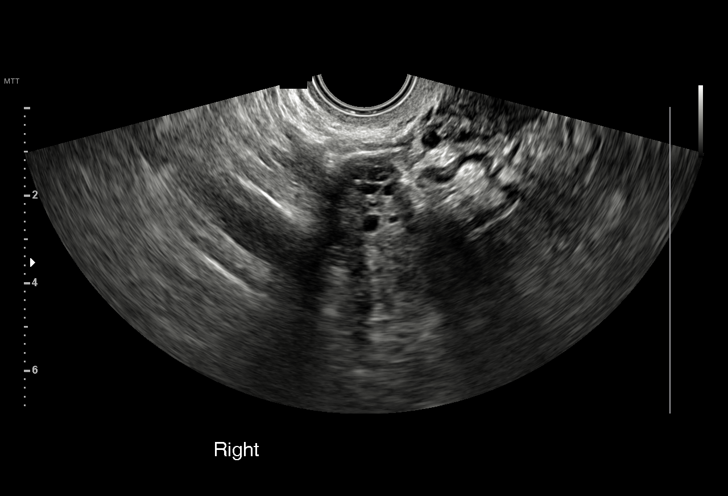
[im 66/85]
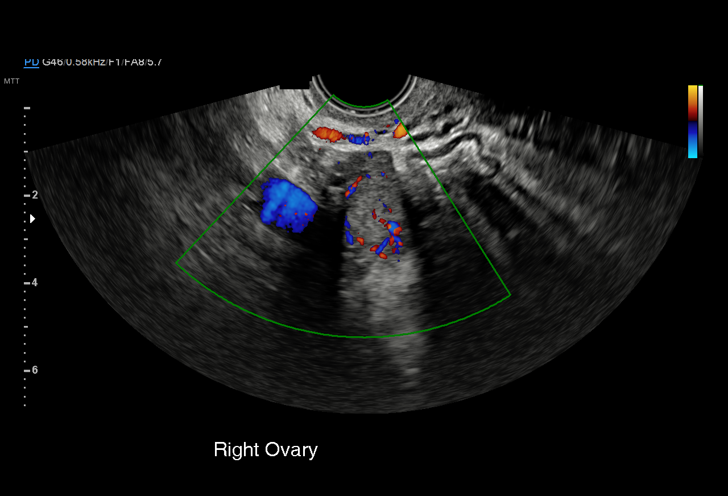
[im 72/85]
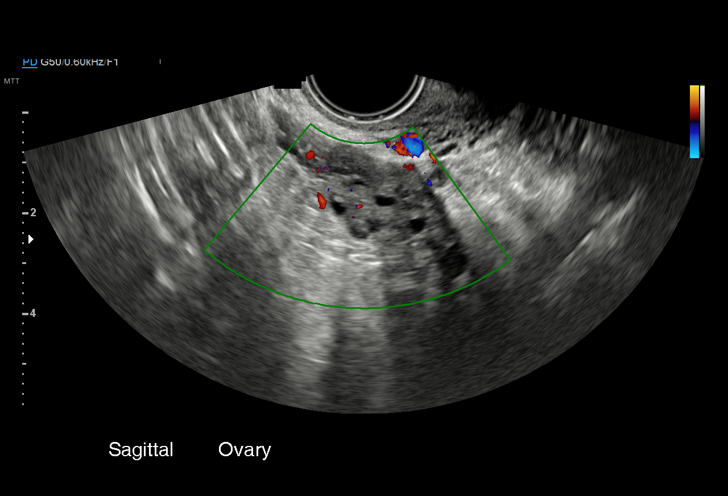
[im 78/85]
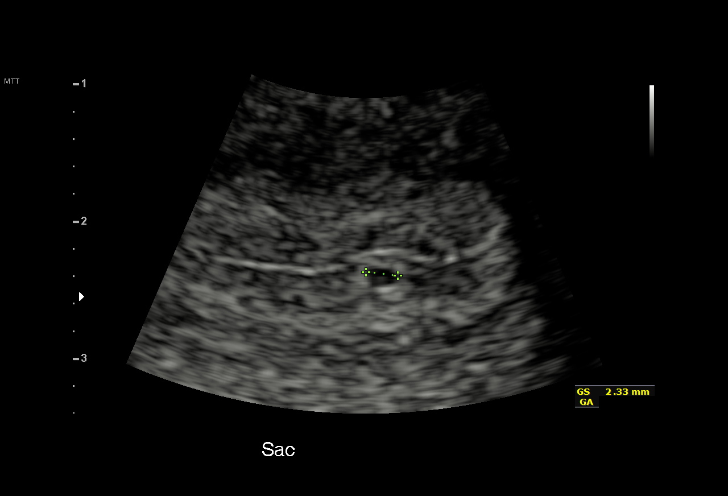
[im 85/85]
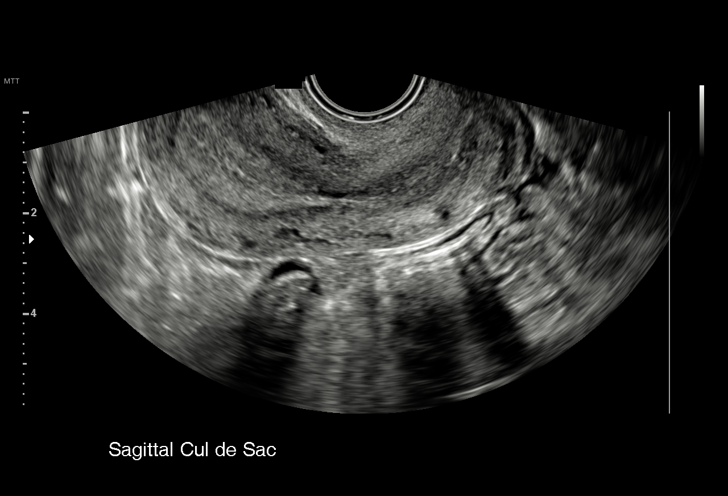

[15 of 28 positions shown; findings below may reference images not displayed]

FINDINGS: Intrauterine gestational sac: Present, single, tiny

Yolk sac:  Not visualized

Embryo:  Not visualized

Cardiac Activity: N/A

Heart Rate: N/A  bpm

MSD: 1.8 mm    w   d  ** below chart range

Subchorionic hemorrhage:  None visualized.

Maternal uterus/adnexae:

Uterus otherwise normal appearance, anteverted.

No endometrial fluid.

RIGHT ovary normal size and morphology 2.5 x 3.2 x 2.6 cm.

LEFT ovary normal size and morphology 2.3 x 1.7 x 2.4 cm.

Trace free pelvic fluid potentially physiologic.

No adnexal masses.
IMPRESSION: Tiny gestational sac seen within the uterus.

No fetal pole identified to establish viability; may consider
follow-up ultrasound in 14 days if clinically indicated to establish
viability.

Remainder of exam unremarkable.

## 2022-03-19 DIAGNOSIS — Z3041 Encounter for surveillance of contraceptive pills: Secondary | ICD-10-CM | POA: Diagnosis not present

## 2022-06-05 ENCOUNTER — Telehealth: Payer: Self-pay | Admitting: Nurse Practitioner

## 2022-06-05 ENCOUNTER — Encounter: Payer: Self-pay | Admitting: Family

## 2022-06-05 ENCOUNTER — Telehealth (INDEPENDENT_AMBULATORY_CARE_PROVIDER_SITE_OTHER): Payer: Medicaid Other | Admitting: Family

## 2022-06-05 DIAGNOSIS — J029 Acute pharyngitis, unspecified: Secondary | ICD-10-CM | POA: Diagnosis not present

## 2022-06-05 MED ORDER — AMOXICILLIN 500 MG PO CAPS: 500.0000 mg | ORAL_CAPSULE | Freq: Two times a day (BID) | ORAL | 0 refills | Status: AC

## 2022-06-05 NOTE — Progress Notes (Signed)
Virtual Visit Consent   Kayla Church, you are scheduled for a virtual visit with a [facility] provider today. Just as with appointments in the office, your consent must be obtained to participate. Your consent will be active for this visit and any virtual visit you may have with one of our providers in the next 365 days. If you have a MyChart account, a copy of this consent can be sent to you electronically.  As this is a virtual visit, video technology does not allow for your provider to perform a traditional examination. This may limit your provider's ability to fully assess your condition. If your provider identifies any concerns that need to be evaluated in person or the need to arrange testing (such as labs, EKG, etc.), we will make arrangements to do so. Although advances in technology are sophisticated, we cannot ensure that it will always work on either your end or our end. If the connection with a video visit is poor, the visit may have to be switched to a telephone visit. With either a video or telephone visit, we are not always able to ensure that we have a secure connection.  By engaging in this virtual visit, you consent to the provision of healthcare and authorize for your insurance to be billed (if applicable) for the services provided during this visit. Depending on your insurance coverage, you may receive a charge related to this service.  I need to obtain your verbal consent now. Are you willing to proceed with your visit today? Salah L Bohorquez has provided verbal consent on 06/05/2022 for a virtual visit (video or telephone). Jannifer Rodney, FNP  Date: 06/05/2022 1:48 PM  Virtual Visit via Video Note   I, Jannifer Rodney, connected with  Kayla Church  ([id number], 05/27/2001) on 06/05/22 at  5:30 PM EDT by a video-enabled telemedicine application and verified that I am speaking with the correct person using two identifiers.  Location: Patient: Virtual Visit Location Patient:  Home Provider: Virtual Visit Location Provider: Office/Clinic   I discussed the limitations of evaluation and management by telemedicine and the availability of in person appointments. The patient expressed understanding and agreed to proceed.    History of Present Illness: Kayla Church is a 21 y.o. who identifies as a female who was assigned female at birth, and is being seen today for sore throat that started yesterday.   HPI: Sore Throat  This is a new problem. The current episode started yesterday. The problem has been gradually worsening. The pain is worse on the left side. There has been no fever. The pain is at a severity of 6/10. The pain is mild. Associated symptoms include congestion, ear pain, headaches, swollen glands (left) and trouble swallowing. Pertinent negatives include no coughing, ear discharge or shortness of breath. She has had no exposure to strep. She has tried acetaminophen and NSAIDs for the symptoms. The treatment provided mild relief.    Problems:  Patient Active Problem List   Diagnosis Date Noted   NSVD (normal spontaneous vaginal delivery) 10/16/2020   Indication for care in labor and delivery, antepartum 10/15/2020   Depression, major, single episode, complete remission (HCC) 05/31/2019   Psychoactive substance-induced psychosis (HCC) 02/06/2019   Drug-induced insomnia (HCC) 04/27/2018   Attention deficit hyperactivity disorder (ADHD), combined type 12/13/2015    Allergies: No Known Allergies Medications:  Current Outpatient Medications:    amoxicillin (AMOXIL) 500 MG capsule, Take 1 capsule (500 mg total) by mouth 2 (two) times daily  for 10 days., Disp: 20 capsule, Rfl: 0   LO LOESTRIN FE 1 MG-10 MCG / 10 MCG tablet, Take 1 tablet by mouth daily., Disp: , Rfl:   Observations/Objective: Patient is well-developed, well-nourished in no acute distress.  Resting comfortably  at home.  Head is normocephalic, atraumatic.  No labored breathing.  Speech is  clear and coherent with logical content.  Patient is alert and oriented at baseline.  Left tonsil white patches and erythemas  Assessment and Plan: 1. Acute pharyngitis, unspecified etiology - amoxicillin (AMOXIL) 500 MG capsule; Take 1 capsule (500 mg total) by mouth 2 (two) times daily for 10 days.  Dispense: 20 capsule; Refill: 0  - Take meds as prescribed - Use a cool mist humidifier  -Use saline nose sprays frequently -Force fluids -For any cough or congestion  Use plain Mucinex- regular strength or max strength is fine -For fever or aces or pains- take tylenol or ibuprofen. -Throat lozenges if help -New toothbrush in 3 days    Follow Up Instructions: I discussed the assessment and treatment plan with the patient. The patient was provided an opportunity to ask questions and all were answered. The patient agreed with the plan and demonstrated an understanding of the instructions.  A copy of instructions were sent to the patient via MyChart unless otherwise noted below.    The patient was advised to call back or seek an in-person evaluation if the symptoms worsen or if the condition fails to improve as anticipated.  Time:  I spent 8 minutes with the patient via telehealth technology discussing the above problems/concerns.    Jannifer Rodney, FNP

## 2022-06-05 NOTE — Patient Instructions (Signed)

## 2022-06-05 NOTE — Telephone Encounter (Signed)
Work note sent to [employer]. Continue OTC medications.

## 2022-06-17 DIAGNOSIS — R059 Cough, unspecified: Secondary | ICD-10-CM | POA: Diagnosis not present

## 2022-06-17 DIAGNOSIS — J329 Chronic sinusitis, unspecified: Secondary | ICD-10-CM | POA: Diagnosis not present

## 2022-06-17 DIAGNOSIS — H10232 Serous conjunctivitis, except viral, left eye: Secondary | ICD-10-CM | POA: Diagnosis not present

## 2022-06-17 DIAGNOSIS — J029 Acute pharyngitis, unspecified: Secondary | ICD-10-CM | POA: Diagnosis not present

## 2022-06-30 ENCOUNTER — Ambulatory Visit (INDEPENDENT_AMBULATORY_CARE_PROVIDER_SITE_OTHER): Payer: Medicaid Other | Admitting: *Deleted

## 2022-06-30 DIAGNOSIS — Z111 Encounter for screening for respiratory tuberculosis: Secondary | ICD-10-CM | POA: Diagnosis not present

## 2022-06-30 NOTE — Progress Notes (Signed)
Pt in today for PPD, placed on left forearm little ways under a freckle.

## 2022-07-02 ENCOUNTER — Ambulatory Visit (INDEPENDENT_AMBULATORY_CARE_PROVIDER_SITE_OTHER): Payer: Medicaid Other | Admitting: *Deleted

## 2022-07-02 DIAGNOSIS — Z111 Encounter for screening for respiratory tuberculosis: Secondary | ICD-10-CM

## 2022-07-02 LAB — TB SKIN TEST
Induration: 0 mm
TB Skin Test: NEGATIVE

## 2022-07-08 DIAGNOSIS — Z3202 Encounter for pregnancy test, result negative: Secondary | ICD-10-CM | POA: Diagnosis not present

## 2022-07-08 DIAGNOSIS — Z3041 Encounter for surveillance of contraceptive pills: Secondary | ICD-10-CM | POA: Diagnosis not present

## 2022-07-08 DIAGNOSIS — B0089 Other herpesviral infection: Secondary | ICD-10-CM | POA: Diagnosis not present

## 2022-07-21 ENCOUNTER — Ambulatory Visit (INDEPENDENT_AMBULATORY_CARE_PROVIDER_SITE_OTHER): Payer: Medicaid Other | Admitting: Nurse Practitioner

## 2022-07-21 ENCOUNTER — Encounter: Payer: Self-pay | Admitting: Nurse Practitioner

## 2022-07-21 VITALS — BP 129/59 | HR 77 | Temp 98.1°F | Resp 20 | Ht 62.0 in | Wt 175.0 lb

## 2022-07-21 DIAGNOSIS — R0981 Nasal congestion: Secondary | ICD-10-CM | POA: Diagnosis not present

## 2022-07-21 DIAGNOSIS — J069 Acute upper respiratory infection, unspecified: Secondary | ICD-10-CM | POA: Diagnosis not present

## 2022-07-21 LAB — VERITOR FLU A/B WAIVED
Influenza A: NEGATIVE
Influenza B: NEGATIVE

## 2022-07-21 MED ORDER — FLUTICASONE PROPIONATE 50 MCG/ACT NA SUSP: 2.0000 | Freq: Every day | NASAL | 6 refills | Status: AC

## 2022-07-21 NOTE — Progress Notes (Signed)
Subjective:    Patient ID: Jana Hakim, female    DOB: [birth year], 21 y.o.   MRN: [id number]   Chief Complaint: Ear Pain (Right/) and Nasal Congestion (Exposed to Covid/)   HPI Patient comes in today c/o right ear pian. Started about 2 days. Pain has not changed. No drainage or hearing loss. Slight nasal congestion. She says she was exposed to covid last week.    Review of Systems  Constitutional:  Positive for fatigue. Negative for chills and fever.  HENT:  Positive for congestion, ear pain and rhinorrhea. Negative for ear discharge, sinus pressure, sore throat and voice change.   Respiratory:  Positive for cough.   Neurological:  Negative for headaches.  All other systems reviewed and are negative.      Objective:   Physical Exam Vitals and nursing note reviewed.  Constitutional:      General: She is not in acute distress.    Appearance: Normal appearance. She is well-developed.  HENT:     Head: Normocephalic.     Right Ear: Tympanic membrane normal. Tenderness present. No middle ear effusion. There is no impacted cerumen.     Left Ear: Tympanic membrane normal. Tenderness present.  No middle ear effusion. There is no impacted cerumen.     Nose: Congestion and rhinorrhea present.     Mouth/Throat:     Mouth: Mucous membranes are moist.  Eyes:     Pupils: Pupils are equal, round, and reactive to light.  Neck:     Vascular: No carotid bruit or JVD.  Cardiovascular:     Rate and Rhythm: Normal rate and regular rhythm.     Heart sounds: Normal heart sounds.  Pulmonary:     Effort: Pulmonary effort is normal. No respiratory distress.     Breath sounds: Normal breath sounds. No wheezing or rales.  Chest:     Chest wall: No tenderness.  Abdominal:     General: Bowel sounds are normal. There is no distension or abdominal bruit.     Palpations: Abdomen is soft. There is no hepatomegaly, splenomegaly, mass or pulsatile mass.     Tenderness: There is no abdominal  tenderness.  Musculoskeletal:        General: Normal range of motion.     Cervical back: Normal range of motion and neck supple.  Lymphadenopathy:     Cervical: No cervical adenopathy.  Skin:    General: Skin is warm and dry.  Neurological:     Mental Status: She is alert and oriented to person, place, and time.     Deep Tendon Reflexes: Reflexes are normal and symmetric.  Psychiatric:        Behavior: Behavior normal.        Thought Content: Thought content normal.        Judgment: Judgment normal.    BP (!) 129/59   Pulse 77   Temp 98.1 F (36.7 C) (Temporal)   Resp 20   Ht 5\' 2"  (1.575 m)   Wt 175 lb (79.4 kg)   SpO2 95%   BMI 32.01 kg/m         Assessment & Plan:  Kyarah L Geesey in today with chief complaint of Ear Pain (Right/) and Nasal Congestion (Exposed to Covid/)   1. Nasal congestion - Veritor Flu A/B Waived  2. URI with cough and congestion 1. Take meds as prescribed 2. Use a cool mist humidifier especially during the winter months and when heat has been humid.  3. Use saline nose sprays frequently 4. Saline irrigations of the nose can be very helpful if done frequently.  * 4X daily for 1 week*  * Use of a nettie pot can be helpful with this. Follow directions with this* 5. Drink plenty of fluids 6. Keep thermostat turn down low 7.For any cough or congestion- robitussin DM 8. For fever or aces or pains- take tylenol or ibuprofen appropriate for age and weight.  * for fevers greater than 101 orally you may alternate ibuprofen and tylenol every  3 hours.   Meds ordered this encounter  Medications   fluticasone (FLONASE) 50 MCG/ACT nasal spray    Sig: Place 2 sprays into both nostrils daily.    Dispense:  16 g    Refill:  6    Order Specific Question:   Supervising Provider    Answer:   Arville Care A [[id number]]   Covid results pending    The above assessment and management plan was discussed with the patient. The patient verbalized  understanding of and has agreed to the management plan. Patient is aware to call the clinic if symptoms persist or worsen. Patient is aware when to return to the clinic for a follow-up visit. Patient educated on when it is appropriate to go to the emergency department.   Mary-Margaret Daphine Deutscher, FNP

## 2022-07-21 NOTE — Patient Instructions (Signed)
Quarantine and Isolation Quarantine and isolation refer to local and travel restrictions to protect the public and travelers from contagious diseases that constitute a public health threat. Contagious diseases are diseases that can spread from one person to another. Quarantine and isolation help to protect the public by preventing exposure to people who have or may have a contagious disease. Isolation separates people who are sick with a contagious disease from people who are not sick. Quarantine separates and restricts the movement of people who were exposed to a contagious disease to see if they become sick. You may be put in quarantine or isolation if you have been exposed to or diagnosed with any of the following diseases: Severe acute respiratory syndromes, such as COVID-19. Cholera. Diphtheria. Tuberculosis. Plague. Smallpox. Yellow fever. Viral hemorrhagic fevers, such as [place], Ebola, and [place]. When to quarantine or isolate Follow these rules, whether you have been vaccinated or not: Stay home and isolate from others when you are sick with a contagious disease. Isolate when you test positive for a contagious disease, even if you do not have symptoms. Isolate if you are sick and suspect that you may have a contagious disease. If you suspect that you have a contagious disease, get tested. If your test results are negative, you can end your isolation. If your test results are positive, follow the full isolation recommendations as told by your health care provider or local health authorities. Quarantine and stay away from others when you have been in close contact with someone who has tested positive for a contagious disease. Close contact is defined as being less than 6 ft (1.8 m) away from an infected person for a total of 15 minutes or more over a 24-hour period. Do not go to places where you are unable to wear a mask, such as restaurants and some gyms. Stay home and separate  from others as much as possible. Avoid being around people who may get very sick from the contagious disease that you have. Use a separate bathroom, if possible. Do not travel. For travel guidance, visit the CDC's travel webpage at wwwnc.cdc.gov/travel/ Follow these instructions at home: Medicines  Take over-the-counter and prescription medicines as told by your health care provider. Finish all antibiotic medicine even when you start to feel better. Stay up to date with all your vaccines. Get scheduled vaccines and boosters as recommended by your health care provider. Lifestyle Wear a high-quality mask if you must be around others at home and in public, if recommended. Improve air flow (ventilation) at home to help prevent the disease from spreading to other people, if possible. Do not share personal household items, like cups, towels, and utensils. Practice everyday hygiene and cleaning. General instructions Talk to your health care provider if you have a weakened body defense system (immune system). People with a weakened immune system may have a reduced immune response to vaccines. You may need to follow current prevention measures, including wearing a well-fitting mask, avoiding crowds, and avoiding poorly ventilated indoor places. Monitor symptoms and follow health care provider instructions, which may include resting, drinking fluids, and taking medicines. Follow specific isolation and quarantine recommendations if you are in places that can lead to disease outbreaks, such as correctional and detention facilities, homeless shelters, and cruise ships. Return to your normal activities as told by your health care provider. Ask your health care provider what activities are safe for you. Keep all follow-up visits. This is important. Where to find more information CDC: www.cdc.gov/quarantine/index.html Contact   a health care provider if: You have a fever. You have signs and symptoms that  return or get worse after isolation. Get help right away if: You have difficulty breathing. You have chest pain. These symptoms may be an emergency. Get help right away. Call 911. Do not wait to see if the symptoms will go away. Do not drive yourself to the hospital. Summary Isolation and quarantine help protect the public by preventing exposure to people who have or may have a contagious disease. Isolate when you are sick or when you test positive, even if you do not have symptoms. Quarantine and stay away from others when you have been in close contact with someone who has tested positive for a contagious disease. This information is not intended to replace advice given to you by your health care provider. Make sure you discuss any questions you have with your health care provider. Document Revised: 09/19/2021 Document Reviewed: 08/29/2021 Elsevier Patient Education  2023 Elsevier Inc.  

## 2022-07-21 NOTE — Addendum Note (Signed)
Addended by: Rolena Infante on: 07/21/2022 12:20 PM   Modules accepted: Orders

## 2022-07-22 LAB — NOVEL CORONAVIRUS, NAA: SARS-CoV-2, NAA: NOT DETECTED

## 2022-07-23 ENCOUNTER — Encounter: Payer: Self-pay | Admitting: Family Medicine

## 2022-07-23 ENCOUNTER — Telehealth: Payer: Self-pay | Admitting: Nurse Practitioner

## 2022-07-23 NOTE — Telephone Encounter (Signed)
Stay home until home test is negative. Treatment based on symptoms. If high feer or short of breath needs to be seen. OTC meds for symptoms otherwise.

## 2022-07-23 NOTE — Telephone Encounter (Signed)
We can do that, but right now it is open ended. Wait until she tests negative and write it then

## 2022-07-23 NOTE — Telephone Encounter (Signed)
No answer, no voicemail.

## 2022-07-23 NOTE — Telephone Encounter (Signed)
Patient is aware.  She would like to know if she can have a work note to cover her for the time she will need to be out.

## 2022-07-23 NOTE — Telephone Encounter (Signed)
Patient return call. ?

## 2022-07-24 NOTE — Telephone Encounter (Signed)
Pt states that she has a note for Mon,Tues. Note provided to excuse for 11/1, 11/2, 11/3. Pt will return to work on Saturday 11/4 as long as symptoms have improved.  Pt will come by the office to pick up note.

## 2022-09-17 DIAGNOSIS — R110 Nausea: Secondary | ICD-10-CM | POA: Diagnosis not present

## 2022-09-17 DIAGNOSIS — R059 Cough, unspecified: Secondary | ICD-10-CM | POA: Diagnosis not present

## 2022-09-17 DIAGNOSIS — B349 Viral infection, unspecified: Secondary | ICD-10-CM | POA: Diagnosis not present

## 2022-09-18 ENCOUNTER — Telehealth (INDEPENDENT_AMBULATORY_CARE_PROVIDER_SITE_OTHER): Payer: Medicaid Other | Admitting: Family Medicine

## 2022-09-18 ENCOUNTER — Encounter: Payer: Self-pay | Admitting: Family Medicine

## 2022-09-18 DIAGNOSIS — J101 Influenza due to other identified influenza virus with other respiratory manifestations: Secondary | ICD-10-CM

## 2022-09-18 DIAGNOSIS — J09X9 Influenza due to identified novel influenza A virus with other manifestations: Secondary | ICD-10-CM | POA: Diagnosis not present

## 2022-09-18 NOTE — Progress Notes (Signed)
Virtual Visit via MyChart Video Note Due to COVID-19 pandemic this visit was conducted virtually. This visit type was conducted due to national recommendations for restrictions regarding the COVID-19 Pandemic (e.g. social distancing, sheltering in place) in an effort to limit this patient's exposure and mitigate transmission in our community. All issues noted in this document were discussed and addressed.  A physical exam was not performed with this format.   I connected with Kayla Church on 09/18/2022 at 1220 by MyChart Video and verified that I am speaking with the correct person using two identifiers. Kayla Church is currently located at home and family is currently with them during visit. The provider, Kari Baars, FNP is located in their office at time of visit.  I discussed the limitations, risks, security and privacy concerns of performing an evaluation and management service by virtual visit and the availability of in person appointments. I also discussed with the patient that there may be a patient responsible charge related to this service. The patient expressed understanding and agreed to proceed.  Subjective:  Patient ID: Kayla Church, female    DOB: 05/30/2001, 21 y.o.   MRN: [id number]  Chief Complaint:  Influenza   HPI: Kayla Church is a 21 y.o. female presenting on 09/18/2022 for Influenza   Pt reports influenza symptoms for the last 3-4 days. States she had headaches, sore throat, abdominal pain, diarrhea, and cold chills. States she has improved greatly. Her daughter was diagnosed with influenza A yesterday and they both have the same symptoms. She has been treating symptomatically at home and is doing well.   Influenza Episode onset: 4 days ago. The problem has been rapidly improving. Associated symptoms include abdominal pain, a change in bowel habit, chills, congestion, coughing, fatigue, a fever, headaches, myalgias and a sore throat. Pertinent negatives include no  anorexia, arthralgias, chest pain, diaphoresis, joint swelling, nausea, neck pain, numbness, rash, swollen glands, urinary symptoms, vertigo, visual change, vomiting or weakness. Nothing aggravates the symptoms. She has tried acetaminophen for the symptoms. The treatment provided significant relief.     Relevant past medical, surgical, family, and social history reviewed and updated as indicated.  Allergies and medications reviewed and updated.   Past Medical History:  Diagnosis Date   ADD (attention deficit disorder)    with inadequate control   Left knee injury 4/12   bruise    Sinus arrhythmia     Past Surgical History:  Procedure Laterality Date   NO PAST SURGERIES      Social History   Socioeconomic History   Marital status: Single    Spouse name: Not on file   Number of children: Not on file   Years of education: Not on file   Highest education level: Not on file  Occupational History   Not on file  Tobacco Use   Smoking status: Every Day    Packs/day: 0.25    Types: Cigarettes   Smokeless tobacco: Never   Tobacco comments:    pt reports 1-3 per day  Vaping Use   Vaping Use: Never used  Substance and Sexual Activity   Alcohol use: No   Drug use: Yes    Types: Marijuana    Comment: pt reports she stopped mid December 2021   Sexual activity: Yes    Birth control/protection: None  Other Topics Concern   Not on file  Social History Narrative   Not on file   Social Determinants of Health   Financial  Resource Strain: Not on file  Food Insecurity: Not on file  Transportation Needs: Not on file  Physical Activity: Not on file  Stress: Not on file  Social Connections: Not on file  Intimate Partner Violence: Not on file    Outpatient Encounter Medications as of 09/18/2022  Medication Sig   fluticasone (FLONASE) 50 MCG/ACT nasal spray Place 2 sprays into both nostrils daily.   LO LOESTRIN FE 1 MG-10 MCG / 10 MCG tablet Take 1 tablet by mouth daily.   No  facility-administered encounter medications on file as of 09/18/2022.    No Known Allergies  Review of Systems  Constitutional:  Positive for activity change, appetite change, chills, fatigue and fever. Negative for diaphoresis and unexpected weight change.  HENT:  Positive for congestion and sore throat. Negative for dental problem, drooling, ear discharge, ear pain, facial swelling, hearing loss, mouth sores, nosebleeds, postnasal drip, rhinorrhea, sinus pressure, sinus pain, sneezing, tinnitus, trouble swallowing and voice change.   Respiratory:  Positive for cough. Negative for shortness of breath.   Cardiovascular:  Negative for chest pain, palpitations and leg swelling.  Gastrointestinal:  Positive for abdominal pain and change in bowel habit. Negative for abdominal distention, anal bleeding, anorexia, blood in stool, constipation, diarrhea, nausea, rectal pain and vomiting.  Genitourinary:  Negative for decreased urine volume.  Musculoskeletal:  Positive for myalgias. Negative for arthralgias, joint swelling and neck pain.  Skin:  Negative for rash.  Neurological:  Positive for headaches. Negative for vertigo, weakness and numbness.  Psychiatric/Behavioral:  Negative for confusion.   All other systems reviewed and are negative.        Observations/Objective: No vital signs or physical exam, this was a virtual health encounter.  Pt alert and oriented, answers all questions appropriately, and able to speak in full sentences.    Assessment and Plan: Kayla Church was seen today for influenza.  Diagnoses and all orders for this visit:  Influenza A Has improved greatly. Continue symptomatic care at home. Report new, worsening, or persistent symptoms.     Follow Up Instructions: Return if symptoms worsen or fail to improve.    I discussed the assessment and treatment plan with the patient. The patient was provided an opportunity to ask questions and all were answered. The patient  agreed with the plan and demonstrated an understanding of the instructions.   The patient was advised to call back or seek an in-person evaluation if the symptoms worsen or if the condition fails to improve as anticipated.  The above assessment and management plan was discussed with the patient. The patient verbalized understanding of and has agreed to the management plan. Patient is aware to call the clinic if they develop any new symptoms or if symptoms persist or worsen. Patient is aware when to return to the clinic for a follow-up visit. Patient educated on when it is appropriate to go to the emergency department.    I provided 11 minutes of time during this MyChart Video encounter.   Kari Baars, FNP-C [facility] [address] [place], [place] [address] [phone] 09/18/2022

## 2022-12-04 DIAGNOSIS — Z3202 Encounter for pregnancy test, result negative: Secondary | ICD-10-CM | POA: Diagnosis not present

## 2022-12-04 DIAGNOSIS — Z0142 Encounter for cervical smear to confirm findings of recent normal smear following initial abnormal smear: Secondary | ICD-10-CM | POA: Diagnosis not present

## 2022-12-04 DIAGNOSIS — Z1151 Encounter for screening for human papillomavirus (HPV): Secondary | ICD-10-CM | POA: Diagnosis not present

## 2022-12-04 DIAGNOSIS — Z1272 Encounter for screening for malignant neoplasm of vagina: Secondary | ICD-10-CM | POA: Diagnosis not present

## 2022-12-04 DIAGNOSIS — Z01419 Encounter for gynecological examination (general) (routine) without abnormal findings: Secondary | ICD-10-CM | POA: Diagnosis not present

## 2022-12-04 DIAGNOSIS — Z124 Encounter for screening for malignant neoplasm of cervix: Secondary | ICD-10-CM | POA: Diagnosis not present

## 2022-12-04 DIAGNOSIS — Z113 Encounter for screening for infections with a predominantly sexual mode of transmission: Secondary | ICD-10-CM | POA: Diagnosis not present

## 2022-12-04 DIAGNOSIS — Z3041 Encounter for surveillance of contraceptive pills: Secondary | ICD-10-CM | POA: Diagnosis not present

## 2022-12-04 DIAGNOSIS — B001 Herpesviral vesicular dermatitis: Secondary | ICD-10-CM | POA: Diagnosis not present

## 2023-01-26 ENCOUNTER — Encounter: Payer: Self-pay | Admitting: Nurse Practitioner

## 2023-01-26 ENCOUNTER — Ambulatory Visit (INDEPENDENT_AMBULATORY_CARE_PROVIDER_SITE_OTHER): Payer: Medicaid Other | Admitting: Nurse Practitioner

## 2023-01-26 VITALS — BP 134/86 | HR 108 | Temp 98.0°F | Resp 20 | Ht 62.0 in | Wt 183.2 lb

## 2023-01-26 DIAGNOSIS — F902 Attention-deficit hyperactivity disorder, combined type: Secondary | ICD-10-CM | POA: Diagnosis not present

## 2023-01-26 DIAGNOSIS — F325 Major depressive disorder, single episode, in full remission: Secondary | ICD-10-CM | POA: Diagnosis not present

## 2023-01-26 MED ORDER — LISDEXAMFETAMINE DIMESYLATE 50 MG PO CAPS: 50.0000 mg | ORAL_CAPSULE | Freq: Every day | ORAL | 0 refills | Status: DC

## 2023-01-26 MED ORDER — CITALOPRAM HYDROBROMIDE 20 MG PO TABS: 20.0000 mg | ORAL_TABLET | Freq: Every day | ORAL | 1 refills | Status: DC

## 2023-01-26 NOTE — Progress Notes (Signed)
Subjective:    Patient ID: Kayla Church, female    DOB: [birth year], 22 y.o.   MRN: [id number]   Chief Complaint: Anxiety and ADHD   Anxiety Patient reports no chest pain, dizziness, palpitations or shortness of breath.      Patient Active Problem List   Diagnosis Date Noted   NSVD (normal spontaneous vaginal delivery) 10/16/2020   Indication for care in labor and delivery, antepartum 10/15/2020   Depression, major, single episode, complete remission (HCC) 05/31/2019   Psychoactive substance-induced psychosis (HCC) 02/06/2019   Drug-induced insomnia (HCC) 04/27/2018   Attention deficit hyperactivity disorder (ADHD), combined type 12/13/2015   Patient come sin today for ADHD follow up and depression: -ADHD- she is currently on no meds. Was on vyvanse. She has a very hoard time concentrating and getting work done. Stays distracted a lot.  - depression- she has been on zoloft an dthat did not help. Seemed to make her worse.    01/26/2023   11:06 AM 01/14/2022   11:06 AM 11/28/2021    3:23 PM  Depression screen PHQ 2/9  Decreased Interest 2 2 0  Down, Depressed, Hopeless 1 2 1   PHQ - 2 Score 3 4 1   Altered sleeping 1 0 0  Tired, decreased energy 2 1 1   Change in appetite 1 1 2   Feeling bad or failure about yourself  1 2 2   Trouble concentrating 1 0 0  Moving slowly or fidgety/restless 1 0 0  Suicidal thoughts 0 0 0  PHQ-9 Score 10 8 6   Difficult doing work/chores Somewhat difficult Somewhat difficult Somewhat difficult      Review of Systems  Constitutional:  Negative for diaphoresis.  Eyes:  Negative for pain.  Respiratory:  Negative for shortness of breath.   Cardiovascular:  Negative for chest pain, palpitations and leg swelling.  Gastrointestinal:  Negative for abdominal pain.  Endocrine: Negative for polydipsia.  Skin:  Negative for rash.  Neurological:  Negative for dizziness, weakness and headaches.  Hematological:  Does not bruise/bleed easily.  All other  systems reviewed and are negative.      Objective:   Physical Exam Constitutional:      Appearance: Normal appearance. She is obese.  Cardiovascular:     Rate and Rhythm: Normal rate and regular rhythm.     Heart sounds: Normal heart sounds.  Pulmonary:     Effort: Pulmonary effort is normal.     Breath sounds: Normal breath sounds.  Skin:    General: Skin is warm.  Neurological:     General: No focal deficit present.     Mental Status: She is alert and oriented to person, place, and time.  Psychiatric:        Mood and Affect: Mood normal.        Behavior: Behavior normal.     BP 134/86   Pulse (!) 108   Temp 98 F (36.7 C) (Temporal)   Resp 20   Ht 5\' 2"  (1.575 m)   Wt 183 lb 3.2 oz (83.1 kg)   SpO2 99%   BMI 33.51 kg/m        Assessment & Plan:   Kayla Church comes in today with chief complaint of Anxiety and ADHD   Diagnosis and orders addressed:  1. Attention deficit hyperactivity disorder (ADHD), combined type Stress management - lisdexamfetamine (VYVANSE) 50 MG capsule; Take 1 capsule (50 mg total) by mouth daily.  Dispense: 30 capsule; Refill: 0 - lisdexamfetamine (VYVANSE) 50 MG  capsule; Take 1 capsule (50 mg total) by mouth daily.  Dispense: 30 capsule; Refill: 0 - lisdexamfetamine (VYVANSE) 50 MG capsule; Take 1 capsule (50 mg total) by mouth daily.  Dispense: 30 capsule; Refill: 0  2. Depression, major, single episode, complete remission (HCC) - citalopram (CELEXA) 20 MG tablet; Take 1 tablet (20 mg total) by mouth daily.  Dispense: 90 tablet; Refill: 1   Labs pending Health Maintenance reviewed Diet and exercise encouraged  Follow up plan: 3 months   Mary-Margaret Daphine Deutscher, FNP

## 2023-01-30 ENCOUNTER — Telehealth: Payer: Self-pay | Admitting: Nurse Practitioner

## 2023-01-30 NOTE — Telephone Encounter (Signed)
Pt made aware. States that she is having a fast heart beat and sweating. Pt states that she has taken 4 doses of the Celexa. Advised that her body needs to have time to adjust to the medication and all should improve within a couple of weeks. Advised to call back if symptoms persist or become worse. Pt understood.

## 2023-01-30 NOTE — Telephone Encounter (Signed)
Yes that is fine

## 2023-04-08 ENCOUNTER — Ambulatory Visit: Payer: Medicaid Other | Admitting: Family Medicine

## 2023-04-08 ENCOUNTER — Encounter: Payer: Self-pay | Admitting: Family Medicine

## 2023-04-08 ENCOUNTER — Telehealth: Payer: Self-pay | Admitting: Nurse Practitioner

## 2023-04-08 VITALS — BP 113/78 | HR 88 | Temp 97.9°F | Ht 62.0 in | Wt 174.8 lb

## 2023-04-08 DIAGNOSIS — K279 Peptic ulcer, site unspecified, unspecified as acute or chronic, without hemorrhage or perforation: Secondary | ICD-10-CM | POA: Diagnosis not present

## 2023-04-08 DIAGNOSIS — R1900 Intra-abdominal and pelvic swelling, mass and lump, unspecified site: Secondary | ICD-10-CM

## 2023-04-08 MED ORDER — PANTOPRAZOLE SODIUM 40 MG PO TBEC: 40.0000 mg | DELAYED_RELEASE_TABLET | Freq: Every day | ORAL | 11 refills | Status: AC

## 2023-04-08 NOTE — Progress Notes (Signed)
Subjective:  Patient ID: Kayla Church, female    DOB: 07/15/2001  Age: 22 y.o. MRN: [id number]  CC: Pain (LEFT SIDED RIB AND STOMACH PAIN/)   HPI Kayla Church presents for shaarp lower abdominal pain. Periumbilical. Very sharp. Pain to touch. Similar to her galbladder pain. Was nauseated a onset on 7/15. Diarrhea is horrible yesterday and two days ago. No fever.   Something bulging at vagina. Has resolved. Had bleeding with sex on about 7/13.      04/08/2023   11:05 AM 01/26/2023   11:06 AM 01/14/2022   11:06 AM  Depression screen PHQ 2/9  Decreased Interest 1 2 2   Down, Depressed, Hopeless 1 1 2   PHQ - 2 Score 2 3 4   Altered sleeping 1 1 0  Tired, decreased energy 1 2 1   Change in appetite 0 1 1  Feeling bad or failure about yourself  0 1 2  Trouble concentrating 0 1 0  Moving slowly or fidgety/restless 0 1 0  Suicidal thoughts 0 0 0  PHQ-9 Score 4 10 8   Difficult doing work/chores Somewhat difficult Somewhat difficult Somewhat difficult    History Kayla Church has a past medical history of ADD (attention deficit disorder), Left knee injury (4/12), and Sinus arrhythmia.   She has a past surgical history that includes No past surgeries.   Her family history includes Bipolar disorder in her father.She reports that she has been smoking cigarettes. She has never used smokeless tobacco. She reports current drug use. Drug: Marijuana. She reports that she does not drink alcohol.    ROS Review of Systems  Constitutional: Negative.   HENT: Negative.    Eyes:  Negative for visual disturbance.  Respiratory:  Negative for shortness of breath.   Cardiovascular:  Negative for chest pain.  Gastrointestinal:  Positive for abdominal pain.  Genitourinary:  Positive for vaginal bleeding. Negative for menstrual problem.  Musculoskeletal:  Negative for arthralgias.    Objective:  BP 113/78   Pulse 88   Temp 97.9 F (36.6 C)   Ht 5\' 2"  (1.575 m)   Wt 174 lb 12.8 oz (79.3 kg)   SpO2 97%    BMI 31.97 kg/m   BP Readings from Last 3 Encounters:  04/08/23 113/78  01/26/23 134/86  07/21/22 (!) 129/59    Wt Readings from Last 3 Encounters:  04/08/23 174 lb 12.8 oz (79.3 kg)  01/26/23 183 lb 3.2 oz (83.1 kg)  07/21/22 175 lb (79.4 kg)     Physical Exam Exam conducted with a chaperone present.  Constitutional:      General: She is not in acute distress.    Appearance: She is well-developed.  HENT:     Head: Normocephalic and atraumatic.  Eyes:     Conjunctiva/sclera: Conjunctivae normal.     Pupils: Pupils are equal, round, and reactive to light.  Neck:     Thyroid: No thyromegaly.  Cardiovascular:     Rate and Rhythm: Normal rate and regular rhythm.     Heart sounds: Normal heart sounds. No murmur heard. Pulmonary:     Effort: Pulmonary effort is normal. No respiratory distress.     Breath sounds: Normal breath sounds. No wheezing or rales.  Abdominal:     General: Bowel sounds are normal. There is no distension.     Palpations: Abdomen is soft.     Tenderness: There is abdominal tenderness (moderate at the lower epigastric area).     Hernia: There is no hernia in  the left inguinal area or right inguinal area.  Genitourinary:    General: Normal vulva.     Exam position: Lithotomy position.     Labia:        Right: No lesion.        Left: No lesion.      Vagina: No signs of injury. No vaginal discharge or lesions.     Cervix: Normal.  Musculoskeletal:        General: Normal range of motion.     Cervical back: Normal range of motion and neck supple.  Lymphadenopathy:     Cervical: No cervical adenopathy.  Skin:    General: Skin is warm and dry.  Neurological:     Mental Status: She is alert and oriented to person, place, and time.  Psychiatric:        Behavior: Behavior normal.        Thought Content: Thought content normal.        Judgment: Judgment normal.    The mass described by pt as no longer being present 1-2 days prior to exam was not  identified on pelvic exam   Assessment & Plan:   Kayla Church was seen today for pain.  Diagnoses and all orders for this visit:  Peptic ulcer -     pantoprazole (PROTONIX) 40 MG tablet; Take 1 tablet (40 mg total) by mouth daily. For stomach  Pelvic mass  No longer present - pt. To follow up prn if it recurs.      I am having Kayla Church start on pantoprazole. I am also having her maintain her Lo Loestrin Fe, fluticasone, valACYclovir, lisdexamfetamine, lisdexamfetamine, lisdexamfetamine, and citalopram.  Allergies as of 04/08/2023   No Known Allergies      Medication List        Accurate as of April 08, 2023  9:38 PM. If you have any questions, ask your nurse or doctor.          citalopram 20 MG tablet Commonly known as: CELEXA Take 1 tablet (20 mg total) by mouth daily.   fluticasone 50 MCG/ACT nasal spray Commonly known as: FLONASE Place 2 sprays into both nostrils daily.   lisdexamfetamine 50 MG capsule Commonly known as: Vyvanse Take 1 capsule (50 mg total) by mouth daily.   lisdexamfetamine 50 MG capsule Commonly known as: Vyvanse Take 1 capsule (50 mg total) by mouth daily.   lisdexamfetamine 50 MG capsule Commonly known as: Vyvanse Take 1 capsule (50 mg total) by mouth daily.   Lo Loestrin Fe 1 MG-10 MCG / 10 MCG tablet Generic drug: Norethindrone-Ethinyl Estradiol-Fe Biphas Take 1 tablet by mouth daily.   pantoprazole 40 MG tablet Commonly known as: PROTONIX Take 1 tablet (40 mg total) by mouth daily. For stomach Started by: Whalen Trompeter   valACYclovir 1000 MG tablet Commonly known as: VALTREX SMARTSIG:2 Tablet(s) By Mouth 1-2 Times Daily PRN         Follow-up: Return if symptoms worsen or fail to improve.  Mechele Claude, M.D.

## 2023-04-08 NOTE — Telephone Encounter (Signed)
Patient was just seen by [facility] today (7/17) and said she has questions for a nurse. No other info given. Please call back

## 2023-04-09 NOTE — Telephone Encounter (Signed)
Lmtcb   Need more information when patient calls back

## 2023-04-15 NOTE — Telephone Encounter (Signed)
Encounter closed, patient never returned call. 

## 2023-04-20 ENCOUNTER — Encounter: Payer: Self-pay | Admitting: Nurse Practitioner

## 2023-04-20 ENCOUNTER — Ambulatory Visit: Payer: Medicaid Other | Admitting: Nurse Practitioner

## 2023-04-20 ENCOUNTER — Ambulatory Visit (INDEPENDENT_AMBULATORY_CARE_PROVIDER_SITE_OTHER): Payer: Medicaid Other

## 2023-04-20 VITALS — BP 115/74 | HR 60 | Temp 97.7°F | Ht 62.0 in | Wt 178.8 lb

## 2023-04-20 DIAGNOSIS — Z0000 Encounter for general adult medical examination without abnormal findings: Secondary | ICD-10-CM | POA: Diagnosis not present

## 2023-04-20 DIAGNOSIS — R0781 Pleurodynia: Secondary | ICD-10-CM | POA: Diagnosis not present

## 2023-04-20 DIAGNOSIS — J4540 Moderate persistent asthma, uncomplicated: Secondary | ICD-10-CM

## 2023-04-20 LAB — PREGNANCY, URINE: Preg Test, Ur: NEGATIVE

## 2023-04-20 MED ORDER — MONTELUKAST SODIUM 10 MG PO TABS
10.0000 mg | ORAL_TABLET | Freq: Every day | ORAL | 0 refills | Status: DC
Start: 2023-04-20 — End: 2023-05-14

## 2023-04-20 NOTE — Progress Notes (Signed)
Acute Office Visit  Subjective:     Patient ID: Kayla Church, female    DOB: 04-19-2001, 22 y.o.   MRN: [id number]  Chief Complaint  Patient presents with   Chest Pain    Seen for rib pain 7/17 still hurting on left side    HPI Kayla Church is a 22 yr old female seen today as an acute visit rib pain. She was  a few weeks ago for the same issues and was dx with GERD ad prescribed PPI. Which she reports helps, but still experiencing pain and SOB/cough at night when laying down.Marland Kitchen Describe the pain sharp 5/33  Cough  22 year old woman, presents with a persistent cough that primarily occurs at night. She reports that this cough has been troubling her for the past three weeks.  Describes the cough as dry and irritating, which often disrupts her sleep and leads to significant discomfort. She mentions that the coughing episodes tend to start within an hour of lying down and can last for several hours, causing her to wake up multiple times during the night. In addition to the cough, she has noted that her throat feels scratchy and she occasionally experiences mild shortness of breath. She does not report any chest pain, wheezing, or production of mucus with the cough. During the day, the cough is less severe, and she is able to carry out her daily activities without major interruptions.  She was recently dx with GERD and currently taking Pantoprazole provided  relief. She has also been using a humidifier in her bedroom, which has not significantly improved her symptoms. There is no history of recent travel, new medications, or exposure to allergens or irritants. He has a history of seasonal allergies but does not currently have any known respiratory conditions like asthma or chronic bronchitis. She denies any recent infections, fever, or weight loss. Her vital signs and physical examination findings are otherwise unremarkable.   LMP 03/28/23, hcg negative   Active Ambulatory Problems    Diagnosis  Date Noted   Attention deficit hyperactivity disorder (ADHD), combined type 12/13/2015   Drug-induced insomnia (HCC) 04/27/2018   Depression, major, single episode, complete remission (HCC) 05/31/2019   Psychoactive substance-induced psychosis (HCC) 02/06/2019   Indication for care in labor and delivery, antepartum 10/15/2020   NSVD (normal spontaneous vaginal delivery) 10/16/2020   Resolved Ambulatory Problems    Diagnosis Date Noted   Rash and nonspecific skin eruption 07/26/2013   Insect bites 07/26/2013   Past Medical History:  Diagnosis Date   ADD (attention deficit disorder)    Left knee injury 4/12   Sinus arrhythmia     Review of Systems  Constitutional: Negative.  Negative for chills and fever.  Respiratory:  Positive for cough and shortness of breath.        At night while laying down  Gastrointestinal:  Negative for nausea and vomiting.  Skin:  Negative for itching and rash.  Neurological:  Negative for dizziness, weakness and headaches.   Negative unless indicated in HPI    Objective:    BP 115/74   Pulse 60   Temp 97.7 F (36.5 C) (Temporal)   Ht 5\' 2"  (1.575 m)   Wt 178 lb 12.8 oz (81.1 kg)   SpO2 98%   BMI 32.70 kg/m  BP Readings from Last 3 Encounters:  04/20/23 115/74  04/08/23 113/78  01/26/23 134/86   Wt Readings from Last 3 Encounters:  04/20/23 178 lb 12.8 oz (81.1 kg)  04/08/23 174 lb 12.8 oz (79.3 kg)  01/26/23 183 lb 3.2 oz (83.1 kg)      Physical Exam Constitutional:      Appearance: She is well-developed and overweight.  HENT:     Head: Normocephalic and atraumatic.     Nose: Nose normal.  Eyes:     General: No scleral icterus.    Extraocular Movements: Extraocular movements intact.     Conjunctiva/sclera: Conjunctivae normal.     Pupils: Pupils are equal, round, and reactive to light.  Cardiovascular:     Rate and Rhythm: Normal rate and regular rhythm.  Pulmonary:     Effort: Pulmonary effort is normal.     Breath sounds:  Normal breath sounds.  Abdominal:     General: Bowel sounds are normal. There is distension. There is no abdominal bruit.     Palpations: Abdomen is soft. There is no shifting dullness, fluid wave, hepatomegaly, splenomegaly, mass or pulsatile mass.     Tenderness: There is abdominal tenderness. There is no guarding. Negative signs include Murphy's sign.  Skin:    General: Skin is warm and dry.  Neurological:     General: No focal deficit present.     Mental Status: She is alert and oriented to person, place, and time. Mental status is at baseline.  Psychiatric:        Mood and Affect: Mood normal.        Behavior: Behavior normal.        Thought Content: Thought content normal.        Judgment: Judgment normal.    Results for orders placed or performed in visit on 04/20/23  Pregnancy, urine  Result Value Ref Range   Preg Test, Ur Negative Negative        Assessment & Plan:  Rib pain on left side -     DG Ribs Unilateral W/Chest Left  Routine medical exam -     Pregnancy, urine  Allergic rhinitis with moderate persistent asthma without status asthmaticus without complication  Start Montelukast 10 mg 1-tab at night  Continue Pantoprazole 40 mg daily  Rib pain A Chest X-Ray was ordered. My reading of this film is preliminary. (No comparison films available: pending review by Radiologist.)     Continue healthy lifestyle choices, including diet (rich in fruits, vegetables, and lean proteins, and low in salt and simple carbohydrates) and exercise (at least 30 minutes of moderate physical activity daily).     The above assessment and management plan was discussed with the patient. The patient verbalized understanding of and has agreed to the management plan. Patient is aware to call the clinic if they develop any new symptoms or if symptoms persist or worsen. Patient is aware when to return to the clinic for a follow-up visit. Patient educated on when it is appropriate to go to  the emergency department.   Return for follow-up as already scheduled with PCP.  Arrie Aran Santa Lighter, DNP [facility] [address] [place], [place] [address] [phone]

## 2023-04-27 ENCOUNTER — Telehealth: Payer: Self-pay

## 2023-04-27 ENCOUNTER — Encounter: Payer: Self-pay | Admitting: Nurse Practitioner

## 2023-04-27 ENCOUNTER — Ambulatory Visit: Payer: Medicaid Other | Admitting: Nurse Practitioner

## 2023-04-27 VITALS — BP 124/67 | HR 66 | Temp 98.2°F | Resp 20 | Ht 62.0 in | Wt 180.0 lb

## 2023-04-27 DIAGNOSIS — Z6832 Body mass index (BMI) 32.0-32.9, adult: Secondary | ICD-10-CM | POA: Diagnosis not present

## 2023-04-27 DIAGNOSIS — F902 Attention-deficit hyperactivity disorder, combined type: Secondary | ICD-10-CM | POA: Diagnosis not present

## 2023-04-27 MED ORDER — LISDEXAMFETAMINE DIMESYLATE 50 MG PO CAPS
50.0000 mg | ORAL_CAPSULE | Freq: Every day | ORAL | 0 refills | Status: DC
Start: 2023-06-25 — End: 2023-06-26

## 2023-04-27 MED ORDER — LISDEXAMFETAMINE DIMESYLATE 50 MG PO CAPS
50.0000 mg | ORAL_CAPSULE | Freq: Every day | ORAL | 0 refills | Status: DC
Start: 2023-04-27 — End: 2023-06-26

## 2023-04-27 MED ORDER — LISDEXAMFETAMINE DIMESYLATE 50 MG PO CAPS
50.0000 mg | ORAL_CAPSULE | Freq: Every day | ORAL | 0 refills | Status: DC
Start: 2023-05-27 — End: 2023-06-26

## 2023-04-27 MED ORDER — WEGOVY 0.25 MG/0.5ML ~~LOC~~ SOAJ
0.2500 mg | SUBCUTANEOUS | 1 refills | Status: DC
Start: 2023-04-27 — End: 2023-07-30

## 2023-04-27 NOTE — Telephone Encounter (Signed)
Pharmacy Patient Advocate Encounter   Received notification from CoverMyMeds that prior authorization for [facility] 0.25MG /0.5ML auto-injectors is required/requested.   Insurance verification completed.   The patient is insured through [facility] .   Per test claim: PA required; PA submitted to [facility] via CoverMyMeds Key/confirmation #/EOC [id number] Status is pending

## 2023-04-27 NOTE — Progress Notes (Signed)
Subjective:    Patient ID: Kayla Church, female    DOB: 2001/05/15, 22 y.o.   MRN: [id number]  Chief Complaint: ADHD and Discuss weight loss meds   HPI Patient come sin today fror refill of adhd meds. She is currently on vyvanse 50mg . Helps her to stay focused. Denies any medication side effects.  Patient Active Problem List   Diagnosis Date Noted   NSVD (normal spontaneous vaginal delivery) 10/16/2020   Indication for care in labor and delivery, antepartum 10/15/2020   Depression, major, single episode, complete remission (HCC) 05/31/2019   Psychoactive substance-induced psychosis (HCC) 02/06/2019   Drug-induced insomnia (HCC) 04/27/2018   Attention deficit hyperactivity disorder (ADHD), combined type 12/13/2015   Would like to go on wegovy for weight loss- Wt Readings from Last 3 Encounters:  04/27/23 180 lb (81.6 kg)  04/20/23 178 lb 12.8 oz (81.1 kg)  04/08/23 174 lb 12.8 oz (79.3 kg)   BMI Readings from Last 3 Encounters:  04/27/23 32.92 kg/m  04/20/23 32.70 kg/m  04/08/23 31.97 kg/m       Review of Systems  Constitutional:  Negative for diaphoresis.  Eyes:  Negative for pain.  Respiratory:  Negative for shortness of breath.   Cardiovascular:  Negative for chest pain, palpitations and leg swelling.  Gastrointestinal:  Negative for abdominal pain.  Endocrine: Negative for polydipsia.  Skin:  Negative for rash.  Neurological:  Negative for dizziness, weakness and headaches.  Hematological:  Does not bruise/bleed easily.  All other systems reviewed and are negative.      Objective:   Physical Exam Vitals and nursing note reviewed.  Constitutional:      General: She is not in acute distress.    Appearance: Normal appearance. She is well-developed.  Neck:     Vascular: No carotid bruit or JVD.  Cardiovascular:     Rate and Rhythm: Normal rate and regular rhythm.     Heart sounds: Normal heart sounds.  Pulmonary:     Effort: Pulmonary effort is normal.  No respiratory distress.     Breath sounds: Normal breath sounds. No wheezing or rales.  Chest:     Chest wall: No tenderness.  Abdominal:     General: There is no abdominal bruit.     Palpations: There is no hepatomegaly, splenomegaly or pulsatile mass.  Musculoskeletal:        General: Normal range of motion.     Cervical back: Normal range of motion and neck supple.  Lymphadenopathy:     Cervical: No cervical adenopathy.  Skin:    General: Skin is warm and dry.  Neurological:     Mental Status: She is alert and oriented to person, place, and time.     Deep Tendon Reflexes: Reflexes are normal and symmetric.  Psychiatric:        Behavior: Behavior normal.        Thought Content: Thought content normal.        Judgment: Judgment normal.    BP 124/67   Pulse 66   Temp 98.2 F (36.8 C) (Temporal)   Resp 20   Ht 5\' 2"  (1.575 m)   Wt 180 lb (81.6 kg)   SpO2 94%   BMI 32.92 kg/m         Assessment & Plan:   Freya L Mcginnis comes in today with chief complaint of ADHD and Discuss weight loss meds   Diagnosis and orders addressed:  1. Attention deficit hyperactivity disorder (ADHD), combined type Stress  management - ToxASSURE Select 13 (MW), Urine - lisdexamfetamine (VYVANSE) 50 MG capsule; Take 1 capsule (50 mg total) by mouth daily.  Dispense: 30 capsule; Refill: 0 - lisdexamfetamine (VYVANSE) 50 MG capsule; Take 1 capsule (50 mg total) by mouth daily.  Dispense: 30 capsule; Refill: 0 - lisdexamfetamine (VYVANSE) 50 MG capsule; Take 1 capsule (50 mg total) by mouth daily.  Dispense: 30 capsule; Refill: 0  2. BMI 32.0-32.9,adult Low fat diet May cause gastric reflux - Semaglutide-Weight Management (WEGOVY) 0.25 MG/0.5ML SOAJ; Inject 0.25 mg into the skin once a week.  Dispense: 2 mL; Refill: 1   Follow up plan: 3 months   Mary-Margaret Daphine Deutscher, FNP

## 2023-04-27 NOTE — Patient Instructions (Signed)
Exercising to Stay Healthy To become healthy and stay healthy, it is recommended that you do moderate-intensity and vigorous-intensity exercise. You can tell that you are exercising at a moderate intensity if your heart starts beating faster and you start breathing faster but can still hold a conversation. You can tell that you are exercising at a vigorous intensity if you are breathing much harder and faster and cannot hold a conversation while exercising. How can exercise benefit me? Exercising regularly is important. It has many health benefits, such as: Improving overall fitness, flexibility, and endurance. Increasing bone density. Helping with weight control. Decreasing body fat. Increasing muscle strength and endurance. Reducing stress and tension, anxiety, depression, or anger. Improving overall health. What guidelines should I follow while exercising? Before you start a new exercise program, talk with your health care provider. Do not exercise so much that you hurt yourself, feel dizzy, or get very short of breath. Wear comfortable clothes and wear shoes with good support. Drink plenty of water while you exercise to prevent dehydration or heat stroke. Work out until your breathing and your heartbeat get faster (moderate intensity). How often should I exercise? Choose an activity that you enjoy, and set realistic goals. Your health care provider can help you make an activity plan that is individually designed and works best for you. Exercise regularly as told by your health care provider. This may include: Doing strength training two times a week, such as: Lifting weights. Using resistance bands. Push-ups. Sit-ups. Yoga. Doing a certain intensity of exercise for a given amount of time. Choose from these options: A total of 150 minutes of moderate-intensity exercise every week. A total of 75 minutes of vigorous-intensity exercise every week. A mix of moderate-intensity and  vigorous-intensity exercise every week. Children, pregnant women, people who have not exercised regularly, people who are overweight, and older adults may need to talk with a health care provider about what activities are safe to perform. If you have a medical condition, be sure to talk with your health care provider before you start a new exercise program. What are some exercise ideas? Moderate-intensity exercise ideas include: Walking 1 mile (1.6 km) in about 15 minutes. Biking. Hiking. Golfing. Dancing. Water aerobics. Vigorous-intensity exercise ideas include: Walking 4.5 miles (7.2 km) or more in about 1 hour. Jogging or running 5 miles (8 km) in about 1 hour. Biking 10 miles (16.1 km) or more in about 1 hour. Lap swimming. Roller-skating or in-line skating. Cross-country skiing. Vigorous competitive sports, such as football, basketball, and soccer. Jumping rope. Aerobic dancing. What are some everyday activities that can help me get exercise? Yard work, such as: Pushing a lawn mower. Raking and bagging leaves. Washing your car. Pushing a stroller. Shoveling snow. Gardening. Washing windows or floors. How can I be more active in my day-to-day activities? Use stairs instead of an elevator. Take a walk during your lunch break. If you drive, park your car farther away from your work or school. If you take public transportation, get off one stop early and walk the rest of the way. Stand up or walk around during all of your indoor phone calls. Get up, stretch, and walk around every 30 minutes throughout the day. Enjoy exercise with a friend. Support to continue exercising will help you keep a regular routine of activity. Where to find more information You can find more information about exercising to stay healthy from: U.[place]. Department of Health and Human Services: www.hhs.gov Centers for Disease Control and Prevention (  CDC): www.cdc.gov Summary Exercising regularly is  important. It will improve your overall fitness, flexibility, and endurance. Regular exercise will also improve your overall health. It can help you control your weight, reduce stress, and improve your bone density. Do not exercise so much that you hurt yourself, feel dizzy, or get very short of breath. Before you start a new exercise program, talk with your health care provider. This information is not intended to replace advice given to you by your health care provider. Make sure you discuss any questions you have with your health care provider. Document Revised: 01/04/2021 Document Reviewed: 01/04/2021 Elsevier Patient Education  2024 Elsevier Inc.  

## 2023-04-29 ENCOUNTER — Other Ambulatory Visit (HOSPITAL_COMMUNITY): Payer: Self-pay

## 2023-04-29 NOTE — Telephone Encounter (Signed)
Pharmacy Patient Advocate Encounter  Received notification from [facility] that Prior Authorization for Wegovy 0.25MG /0.5ML auto-injectors has been APPROVED from 04/27/23 to 10/24/23   PA #/Case ID/Reference #: [id number]  [facility], next fill 05/19/23

## 2023-05-14 ENCOUNTER — Other Ambulatory Visit: Payer: Self-pay | Admitting: *Deleted

## 2023-05-14 DIAGNOSIS — J4540 Moderate persistent asthma, uncomplicated: Secondary | ICD-10-CM

## 2023-05-14 DIAGNOSIS — R0781 Pleurodynia: Secondary | ICD-10-CM

## 2023-05-14 MED ORDER — MONTELUKAST SODIUM 10 MG PO TABS: 10.0000 mg | ORAL_TABLET | Freq: Every day | ORAL | 1 refills | Status: AC

## 2023-06-25 ENCOUNTER — Telehealth: Payer: Self-pay | Admitting: Nurse Practitioner

## 2023-06-25 NOTE — Telephone Encounter (Signed)
Please advise if she should still be on this dose. Last OV 04/27/23

## 2023-06-25 NOTE — Telephone Encounter (Signed)
  Prescription Request  06/25/2023  Is this a "Controlled Substance" medicine? Semaglutide-Weight Management (WEGOVY) 0.25 MG/0.5ML SOAJ   Have you seen your PCP in the last 2 weeks? Upcoming apt   If YES, route message to pool  -  If NO, patient needs to be scheduled for appointment 07/24/2023  What is the name of the medication or equipment? Semaglutide-Weight Management (WEGOVY) 0.25 MG/0.5ML SOAJ   Have you contacted your pharmacy to request a refill? no   Which pharmacy would you like this sent to? [facility]   Patient notified that their request is being sent to the clinical staff for review and that they should receive a response within 2 business days.

## 2023-06-25 NOTE — Telephone Encounter (Signed)
Needs to be seen  to weigh

## 2023-06-26 ENCOUNTER — Ambulatory Visit: Payer: Medicaid Other | Admitting: Nurse Practitioner

## 2023-06-26 ENCOUNTER — Encounter: Payer: Self-pay | Admitting: Nurse Practitioner

## 2023-06-26 ENCOUNTER — Telehealth: Payer: Self-pay | Admitting: Nurse Practitioner

## 2023-06-26 VITALS — BP 126/85 | HR 83 | Temp 98.5°F | Resp 20 | Ht 62.0 in | Wt 168.0 lb

## 2023-06-26 DIAGNOSIS — F902 Attention-deficit hyperactivity disorder, combined type: Secondary | ICD-10-CM | POA: Diagnosis not present

## 2023-06-26 DIAGNOSIS — F325 Major depressive disorder, single episode, in full remission: Secondary | ICD-10-CM | POA: Diagnosis not present

## 2023-06-26 MED ORDER — LISDEXAMFETAMINE DIMESYLATE 40 MG PO CAPS
40.0000 mg | ORAL_CAPSULE | ORAL | 0 refills | Status: DC
Start: 2023-06-26 — End: 2023-09-25

## 2023-06-26 MED ORDER — LISDEXAMFETAMINE DIMESYLATE 40 MG PO CAPS: 40.0000 mg | ORAL_CAPSULE | ORAL | 0 refills | Status: DC

## 2023-06-26 NOTE — Progress Notes (Signed)
Subjective:    Patient ID: Kayla Church, female    DOB: 04-Aug-2001, 22 y.o.   MRN: [id number]   Chief Complaint: Medical Management of Chronic Issues    HPI:  Kayla Church is a 22 y.o. who identifies as a female who was assigned female at birth.     Comes in today for follow up of the following chronic medical issues:  1. Attention deficit hyperactivity disorder (ADHD), combined type Patient has been on ADHD meds since middle school. Sh ecannot concentrate at work without meds. She is currently on vyvanse 50mg  daily. Seems to be working but she is a little shaky and very talkative.  2. Depression, major, single episode, complete remission (HCC) I son celexa nad is doing well.    06/26/2023    2:25 PM 04/27/2023   11:52 AM 04/20/2023   10:08 AM  Depression screen PHQ 2/9  Decreased Interest 2 1 1   Down, Depressed, Hopeless 2 0 0  PHQ - 2 Score 4 1 1   Altered sleeping 0 1 1  Tired, decreased energy 1 1 1   Change in appetite 2 0 0  Feeling bad or failure about yourself  1 0 0  Trouble concentrating 0 0 0  Moving slowly or fidgety/restless 2 0 0  Suicidal thoughts 0 0 0  PHQ-9 Score 10 3 3   Difficult doing work/chores Somewhat difficult Not difficult at all Not difficult at all      New complaints: None today  No Known Allergies Outpatient Encounter Medications as of 06/26/2023  Medication Sig   citalopram (CELEXA) 20 MG tablet Take 1 tablet (20 mg total) by mouth daily.   fluticasone (FLONASE) 50 MCG/ACT nasal spray Place 2 sprays into both nostrils daily.   lisdexamfetamine (VYVANSE) 50 MG capsule Take 1 capsule (50 mg total) by mouth daily.   lisdexamfetamine (VYVANSE) 50 MG capsule Take 1 capsule (50 mg total) by mouth daily.   montelukast (SINGULAIR) 10 MG tablet Take 1 tablet (10 mg total) by mouth at bedtime.   pantoprazole (PROTONIX) 40 MG tablet Take 1 tablet (40 mg total) by mouth daily. For stomach   Semaglutide-Weight Management (WEGOVY) 0.25 MG/0.5ML  SOAJ Inject 0.25 mg into the skin once a week.   valACYclovir (VALTREX) 1000 MG tablet SMARTSIG:2 Tablet(s) By Mouth 1-2 Times Daily PRN   lisdexamfetamine (VYVANSE) 50 MG capsule Take 1 capsule (50 mg total) by mouth daily.   LO LOESTRIN FE 1 MG-10 MCG / 10 MCG tablet Take 1 tablet by mouth daily. (Patient not taking: Reported on 06/26/2023)   No facility-administered encounter medications on file as of 06/26/2023.    Past Surgical History:  Procedure Laterality Date   NO PAST SURGERIES      Family History  Problem Relation Age of Onset   Bipolar disorder Father       Controlled substance contract: n/a     Review of Systems  Constitutional:  Negative for diaphoresis.  Eyes:  Negative for pain.  Respiratory:  Negative for shortness of breath.   Cardiovascular:  Negative for chest pain, palpitations and leg swelling.  Gastrointestinal:  Negative for abdominal pain.  Endocrine: Negative for polydipsia.  Skin:  Negative for rash.  Neurological:  Negative for dizziness, weakness and headaches.  Hematological:  Does not bruise/bleed easily.  All other systems reviewed and are negative.      Objective:   Physical Exam Vitals and nursing note reviewed.  Constitutional:      General: She is  not in acute distress.    Appearance: Normal appearance. She is well-developed.  HENT:     Head: Normocephalic.     Right Ear: Tympanic membrane normal.     Left Ear: Tympanic membrane normal.     Nose: Nose normal.     Mouth/Throat:     Mouth: Mucous membranes are moist.  Eyes:     Pupils: Pupils are equal, round, and reactive to light.  Neck:     Vascular: No carotid bruit or JVD.  Cardiovascular:     Rate and Rhythm: Normal rate and regular rhythm.     Heart sounds: Normal heart sounds.  Pulmonary:     Effort: Pulmonary effort is normal. No respiratory distress.     Breath sounds: Normal breath sounds. No wheezing or rales.  Chest:     Chest wall: No tenderness.  Abdominal:      General: Bowel sounds are normal. There is no distension or abdominal bruit.     Palpations: Abdomen is soft. There is no hepatomegaly, splenomegaly, mass or pulsatile mass.     Tenderness: There is no abdominal tenderness.  Musculoskeletal:        General: Normal range of motion.     Cervical back: Normal range of motion and neck supple.  Lymphadenopathy:     Cervical: No cervical adenopathy.  Skin:    General: Skin is warm and dry.  Neurological:     Mental Status: She is alert and oriented to person, place, and time.     Deep Tendon Reflexes: Reflexes are normal and symmetric.  Psychiatric:        Behavior: Behavior normal.        Thought Content: Thought content normal.        Judgment: Judgment normal.    BP 126/85   Pulse 83   Temp 98.5 F (36.9 C) (Temporal)   Resp 20   Ht 5\' 2"  (1.575 m)   Wt 168 lb (76.2 kg)   SpO2 97%   BMI 30.73 kg/m         Assessment & Plan:  Kayla Church comes in today with chief complaint of Medical Management of Chronic Issues   Diagnosis and orders addressed:  1. Attention deficit hyperactivity disorder (ADHD), combined type Decreased vyvanse to 40mg  daily  2. Depression, major, single episode, complete remission [facility]) Stress management     Follow up plan: 3 months   Mary-Margaret Daphine Deutscher, FNP

## 2023-06-26 NOTE — Telephone Encounter (Signed)
Patient seen in office today. 

## 2023-06-30 NOTE — Telephone Encounter (Signed)
Patient aware and verbalized understanding. °

## 2023-06-30 NOTE — Telephone Encounter (Signed)
Patient is calling to check on what to do about the [facility]. [facility] They can only get 1 mg and up. Please call.

## 2023-06-30 NOTE — Telephone Encounter (Signed)
Sorry but nothing we can do until no longer on back order

## 2023-07-24 ENCOUNTER — Ambulatory Visit: Payer: Medicaid Other | Admitting: Nurse Practitioner

## 2023-07-25 ENCOUNTER — Other Ambulatory Visit: Payer: Self-pay | Admitting: Nurse Practitioner

## 2023-07-25 DIAGNOSIS — F325 Major depressive disorder, single episode, in full remission: Secondary | ICD-10-CM

## 2023-07-29 ENCOUNTER — Other Ambulatory Visit: Payer: Self-pay | Admitting: Nurse Practitioner

## 2023-07-29 DIAGNOSIS — Z6832 Body mass index (BMI) 32.0-32.9, adult: Secondary | ICD-10-CM

## 2023-07-30 ENCOUNTER — Other Ambulatory Visit: Payer: Self-pay | Admitting: Nurse Practitioner

## 2023-07-30 DIAGNOSIS — Z6832 Body mass index (BMI) 32.0-32.9, adult: Secondary | ICD-10-CM

## 2023-07-30 MED ORDER — WEGOVY 0.25 MG/0.5ML ~~LOC~~ SOAJ: 0.5000 mg | SUBCUTANEOUS | 1 refills | Status: DC

## 2023-07-30 NOTE — Progress Notes (Unsigned)
Increased wegovy dose to 0.5mg  weekly- new script sent to pharmacy  Meds ordered this encounter  Medications   Semaglutide-Weight Management (WEGOVY) 0.25 MG/0.5ML SOAJ    Sig: Inject 0.5 mg into the skin once a week.    Dispense:  2 mL    Refill:  1    Order Specific Question:   Supervising Provider    Answer:   Nils Pyle [[id number]]   Mary-Margaret Daphine Deutscher, FNP

## 2023-07-30 NOTE — Progress Notes (Signed)
Patient aware and verbalizes understanding. 

## 2023-08-03 ENCOUNTER — Other Ambulatory Visit: Payer: Self-pay | Admitting: Nurse Practitioner

## 2023-08-03 ENCOUNTER — Telehealth: Payer: Self-pay | Admitting: Family Medicine

## 2023-08-03 DIAGNOSIS — Z6832 Body mass index (BMI) 32.0-32.9, adult: Secondary | ICD-10-CM

## 2023-08-03 NOTE — Telephone Encounter (Unsigned)
Copied from CRM [id number]. Topic: Clinical - Medication Question >> Aug 03, 2023 10:12 AM Theodis Sato wrote: Reason for CRM: PT is not sure of how much of her medication she supposed to be taking after she was told that Mary-Margaret upped her [facility] dosage- Please call PT back [phone]

## 2023-08-03 NOTE — Telephone Encounter (Signed)
Please review

## 2023-08-04 ENCOUNTER — Ambulatory Visit: Payer: Medicaid Other | Admitting: Nurse Practitioner

## 2023-08-04 ENCOUNTER — Encounter: Payer: Self-pay | Admitting: Nurse Practitioner

## 2023-08-04 VITALS — BP 119/80 | HR 95 | Temp 97.7°F | Resp 20 | Ht 62.0 in | Wt 161.0 lb

## 2023-08-04 DIAGNOSIS — F325 Major depressive disorder, single episode, in full remission: Secondary | ICD-10-CM

## 2023-08-04 DIAGNOSIS — Z6829 Body mass index (BMI) 29.0-29.9, adult: Secondary | ICD-10-CM

## 2023-08-04 DIAGNOSIS — F902 Attention-deficit hyperactivity disorder, combined type: Secondary | ICD-10-CM

## 2023-08-04 MED ORDER — CITALOPRAM HYDROBROMIDE 40 MG PO TABS: 40.0000 mg | ORAL_TABLET | Freq: Every day | ORAL | 5 refills | Status: DC

## 2023-08-04 NOTE — Patient Instructions (Signed)

## 2023-08-04 NOTE — Progress Notes (Signed)
Subjective:    Patient ID: Kayla Church, female    DOB: 06/30/2001, 22 y.o.   MRN: [id number]   Chief Complaint: No chief complaint on file.    HPI:  Kayla Church is a 22 y.o. who identifies as a female who was assigned female at birth.   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 Attention deficit hyperactivity disorder (ADHD), combined type Patient is on vyvanse and is doing well. No medication side effects.  2. Depression, major, single episode, complete remission (HCC) Is on celexa and is doing well.    06/26/2023    2:25 PM 04/27/2023   11:52 AM 04/20/2023   10:08 AM  Depression screen PHQ 2/9  Decreased Interest 2 1 1   Down, Depressed, Hopeless 2 0 0  PHQ - 2 Score 4 1 1   Altered sleeping 0 1 1  Tired, decreased energy 1 1 1   Change in appetite 2 0 0  Feeling bad or failure about yourself  1 0 0  Trouble concentrating 0 0 0  Moving slowly or fidgety/restless 2 0 0  Suicidal thoughts 0 0 0  PHQ-9 Score 10 3 3   Difficult doing work/chores Somewhat difficult Not difficult at all Not difficult at all      06/26/2023    2:26 PM 04/27/2023   11:53 AM 04/20/2023   10:08 AM 04/08/2023   11:06 AM  GAD 7 : Generalized Anxiety Score  Nervous, Anxious, on Edge 1 1 0 0  Control/stop worrying 2 0 2 0  Worry too much - different things 2 0 1 0  Trouble relaxing 2 1 1 2   Restless 0 0 0 0  Easily annoyed or irritable 2 2 1 2   Afraid - awful might happen 0 0 0 0  Total GAD 7 Score 9 4 5 4   Anxiety Difficulty Somewhat difficult Not difficult at all Not difficult at all      3. BMI 30.0-30.9 Patient is on wegovy and is doing well. Wt Readings from Last 3 Encounters:  08/04/23 161 lb (73 kg)  06/26/23 168 lb (76.2 kg)  04/27/23 180 lb (81.6 kg)   BMI Readings from Last 3 Encounters:  08/04/23 29.45 kg/m  06/26/23 30.73 kg/m  04/27/23 32.92 kg/m       New  complaints: None today  No Known Allergies Outpatient Encounter Medications as of 08/04/2023  Medication Sig   citalopram (CELEXA) 20 MG tablet TAKE 1 TABLET BY MOUTH EVERY DAY   fluticasone (FLONASE) 50 MCG/ACT nasal spray Place 2 sprays into both nostrils daily.   lisdexamfetamine (VYVANSE) 40 MG capsule Take 1 capsule (40 mg total) by mouth every morning.   lisdexamfetamine (VYVANSE) 40 MG capsule Take 1 capsule (40 mg total) by mouth every morning.   [START ON 08/25/2023] lisdexamfetamine (VYVANSE) 40 MG capsule Take 1 capsule (40 mg total) by mouth every morning.   LO LOESTRIN FE 1 MG-10 MCG / 10 MCG tablet Take 1 tablet by mouth daily. (Patient not taking: Reported on 06/26/2023)   montelukast (SINGULAIR) 10 MG tablet Take 1 tablet (10 mg total) by mouth at bedtime.   pantoprazole (PROTONIX) 40 MG tablet Take 1 tablet (40 mg total) by mouth daily. For stomach   Semaglutide-Weight Management (WEGOVY) 0.25 MG/0.5ML SOAJ INJECT 0.5 MG INTO THE SKIN ONE TIME PER WEEK   valACYclovir (VALTREX) 1000 MG tablet SMARTSIG:2  Tablet(s) By Mouth 1-2 Times Daily PRN   No facility-administered encounter medications on file as of 08/04/2023.    Past Surgical History:  Procedure Laterality Date   NO PAST SURGERIES      Family History  Problem Relation Age of Onset   Bipolar disorder Father       Controlled substance contract: n/a     Review of Systems  Constitutional:  Negative for diaphoresis.  Eyes:  Negative for pain.  Respiratory:  Negative for shortness of breath.   Cardiovascular:  Negative for chest pain, palpitations and leg swelling.  Gastrointestinal:  Negative for abdominal pain.  Endocrine: Negative for polydipsia.  Skin:  Negative for rash.  Neurological:  Negative for dizziness, weakness and headaches.  Hematological:  Does not bruise/bleed easily.  All other systems reviewed and are negative.      Objective:   Physical Exam Vitals and nursing note reviewed.   Constitutional:      General: She is not in acute distress.    Appearance: Normal appearance. She is well-developed.  Neck:     Vascular: No carotid bruit or JVD.  Cardiovascular:     Rate and Rhythm: Normal rate and regular rhythm.     Heart sounds: Normal heart sounds.  Pulmonary:     Effort: Pulmonary effort is normal. No respiratory distress.     Breath sounds: Normal breath sounds. No wheezing or rales.  Chest:     Chest wall: No tenderness.  Abdominal:     General: Bowel sounds are normal. There is no distension or abdominal bruit.     Palpations: Abdomen is soft. There is no hepatomegaly, splenomegaly, mass or pulsatile mass.     Tenderness: There is no abdominal tenderness.  Musculoskeletal:        General: Normal range of motion.     Cervical back: Normal range of motion and neck supple.  Lymphadenopathy:     Cervical: No cervical adenopathy.  Skin:    General: Skin is warm and dry.  Neurological:     Mental Status: She is alert and oriented to person, place, and time.     Deep Tendon Reflexes: Reflexes are normal and symmetric.  Psychiatric:        Behavior: Behavior normal.        Thought Content: Thought content normal.        Judgment: Judgment normal.    BP 119/80   Pulse 95   Temp 97.7 F (36.5 C) (Temporal)   Resp 20   Ht 5\' 2"  (1.575 m)   Wt 161 lb (73 kg)   BMI 29.45 kg/m         Assessment & Plan:   Kayla Church in today with chief complaint of No chief complaint on file.   1. Attention deficit hyperactivity disorder (ADHD), combined type Continue vyvanse as prescribed  2. Depression, major, single episode, complete remission (HCC) Stress management Increase celexa 40mg  daily - citalopram (CELEXA) 40 MG tablet; Take 1 tablet (40 mg total) by mouth daily.  Dispense: 30 tablet; Refill: 5  3. BMI 29.0-29.9,adult Discussed diet and exercise for person with BMI >25 Will recheck weight in 3-6 months Okay to increase to 0.5mg   weekly    The above assessment and management plan was discussed with the patient. The patient verbalized understanding of and has agreed to the management plan. Patient is aware to call the clinic if symptoms persist or worsen. Patient is aware when to return to the clinic  for a follow-up visit. Patient educated on when it is appropriate to go to the emergency department.   Mary-Margaret Daphine Deutscher, FNP

## 2023-08-25 MED ORDER — WEGOVY 0.5 MG/0.5ML ~~LOC~~ SOAJ: 0.5000 mg | SUBCUTANEOUS | 2 refills | Status: AC

## 2023-08-25 NOTE — Telephone Encounter (Signed)
PT IS STILL ON .25 WEGOOVY  SHE WOULD LIKE A CALL BACK

## 2023-08-25 NOTE — Telephone Encounter (Signed)
Dose of wegovy increased to 0.5mg  weekly  Meds ordered this encounter  Medications   Semaglutide-Weight Management (WEGOVY) 0.5 MG/0.5ML SOAJ    Sig: Inject 0.5 mg into the skin once a week.    Dispense:  2 mL    Refill:  2    Order Specific Question:   Supervising Provider    Answer:   Nils Pyle [[id number]]   Mary-Margaret Daphine Deutscher, FNP

## 2023-09-09 ENCOUNTER — Encounter (HOSPITAL_BASED_OUTPATIENT_CLINIC_OR_DEPARTMENT_OTHER): Payer: Self-pay | Admitting: Emergency Medicine

## 2023-09-09 ENCOUNTER — Emergency Department (HOSPITAL_BASED_OUTPATIENT_CLINIC_OR_DEPARTMENT_OTHER)
Admission: EM | Admit: 2023-09-09 | Discharge: 2023-09-09 | Disposition: A | Discharge status: Home / Self Care | Payer: Medicaid Other | Attending: Emergency Medicine | Admitting: Emergency Medicine

## 2023-09-09 ENCOUNTER — Other Ambulatory Visit: Payer: Self-pay

## 2023-09-09 DIAGNOSIS — K529 Noninfective gastroenteritis and colitis, unspecified: Secondary | ICD-10-CM | POA: Diagnosis not present

## 2023-09-09 DIAGNOSIS — R197 Diarrhea, unspecified: Secondary | ICD-10-CM | POA: Diagnosis present

## 2023-09-09 DIAGNOSIS — R9431 Abnormal electrocardiogram [ECG] [EKG]: Secondary | ICD-10-CM | POA: Diagnosis not present

## 2023-09-09 LAB — COMPREHENSIVE METABOLIC PANEL
ALT: 13 U/L (ref 0–44)
AST: 15 U/L (ref 15–41)
Albumin: 5 g/dL (ref 3.5–5.0)
Alkaline Phosphatase: 65 U/L (ref 38–126)
Anion gap: 13 (ref 5–15)
BUN: 9 mg/dL (ref 6–20)
CO2: 19 mmol/L — ABNORMAL LOW (ref 22–32)
Calcium: 9.8 mg/dL (ref 8.9–10.3)
Chloride: 104 mmol/L (ref 98–111)
Creatinine, Ser: 0.66 mg/dL (ref 0.44–1.00)
GFR, Estimated: >60 mL/min (ref >60)
Glucose, Bld: 139 mg/dL — ABNORMAL HIGH (ref 70–99)
Potassium: 4.1 mmol/L (ref 3.5–5.1)
Sodium: 136 mmol/L (ref 135–145)
Total Bilirubin: 0.6 mg/dL (ref <1.2)
Total Protein: 7.9 g/dL (ref 6.5–8.1)

## 2023-09-09 LAB — DIFFERENTIAL
Abs Immature Granulocytes: 0.13 10*3/uL — ABNORMAL HIGH (ref 0.00–0.07)
Basophils Absolute: 0 10*3/uL (ref 0.0–0.1)
Basophils Relative: 0 %
Eosinophils Absolute: 0 10*3/uL (ref 0.0–0.5)
Eosinophils Relative: 0 %
Immature Granulocytes: 1 %
Lymphocytes Relative: 5 %
Lymphs Abs: 1.1 10*3/uL (ref 0.7–4.0)
Monocytes Absolute: 0.6 10*3/uL (ref 0.1–1.0)
Monocytes Relative: 3 %
Neutro Abs: 18.2 10*3/uL — ABNORMAL HIGH (ref 1.7–7.7)
Neutrophils Relative %: 91 %

## 2023-09-09 LAB — LIPASE, BLOOD: Lipase: <10 U/L — ABNORMAL LOW (ref 11–51)

## 2023-09-09 LAB — CBC
HCT: 44.5 % (ref 36.0–46.0)
Hemoglobin: 14.8 g/dL (ref 12.0–15.0)
MCH: 30.6 pg (ref 26.0–34.0)
MCHC: 33.3 g/dL (ref 30.0–36.0)
MCV: 92.1 fL (ref 80.0–100.0)
Platelets: 220 10*3/uL (ref 150–400)
RBC: 4.83 MIL/uL (ref 3.87–5.11)
RDW: 13.1 % (ref 11.5–15.5)
WBC: 21.9 10*3/uL — ABNORMAL HIGH (ref 4.0–10.5)
nRBC: 0 % (ref 0.0–0.2)

## 2023-09-09 LAB — CBG MONITORING, ED: Glucose-Capillary: 158 mg/dL — ABNORMAL HIGH (ref 70–99)

## 2023-09-09 MED ORDER — ONDANSETRON 4 MG PO TBDP
4.0000 mg | ORAL_TABLET | Freq: Once | ORAL | Status: AC | PRN
  Administered 2023-09-09: 4 mg via ORAL
  Filled 2023-09-09: qty 1

## 2023-09-09 MED ORDER — ONDANSETRON HCL 4 MG/2ML IJ SOLN
4.0000 mg | Freq: Once | INTRAMUSCULAR | Status: AC
Start: 2023-09-09 — End: 2023-09-09
  Administered 2023-09-09: 4 mg via INTRAVENOUS
  Filled 2023-09-09: qty 2

## 2023-09-09 MED ORDER — ONDANSETRON 4 MG PO TBDP: 4.0000 mg | ORAL_TABLET | Freq: Three times a day (TID) | ORAL | 1 refills | Status: DC | PRN

## 2023-09-09 MED ORDER — SODIUM CHLORIDE 0.9 % IV BOLUS
1000.0000 mL | Freq: Once | INTRAVENOUS | Status: AC
  Administered 2023-09-09: 1000 mL via INTRAVENOUS

## 2023-09-09 NOTE — ED Provider Notes (Addendum)
[place] EMERGENCY DEPARTMENT AT [facility] Provider Note   CSN: [id number] Arrival date & time: 09/09/23  1947     History  Chief Complaint  Patient presents with   Emesis    Kayla Church is a 22 y.o. female.  Patient felt fine today until around 1700.  Then had sudden onset of diaphoresis nausea vomiting 1 episode of diarrhea.  Patient's husband stated that she felt very cool.  Patient did have a little bit of heart racing.  Patient recently had an increase and will [place].  But this was fairly sudden onset.  Denies any upper respiratory symptoms.  Denies any significant abdominal pain.  Past medical history significant for attention deficit disorder patient is an everyday smoker.       Home Medications Prior to Admission medications   Medication Sig Start Date End Date Taking? Authorizing Provider  citalopram (CELEXA) 40 MG tablet Take 1 tablet (40 mg total) by mouth daily. 08/04/23   Daphine Deutscher, Mary-Margaret, FNP  fluticasone (FLONASE) 50 MCG/ACT nasal spray Place 2 sprays into both nostrils daily. 07/21/22   Daphine Deutscher, Mary-Margaret, FNP  lisdexamfetamine (VYVANSE) 40 MG capsule Take 1 capsule (40 mg total) by mouth every morning. 06/26/23 07/26/23  Daphine Deutscher Mary-Margaret, FNP  lisdexamfetamine (VYVANSE) 40 MG capsule Take 1 capsule (40 mg total) by mouth every morning. 07/26/23 08/25/23  Daphine Deutscher Mary-Margaret, FNP  lisdexamfetamine (VYVANSE) 40 MG capsule Take 1 capsule (40 mg total) by mouth every morning. 08/25/23 09/24/23  Daphine Deutscher, Mary-Margaret, FNP  montelukast (SINGULAIR) 10 MG tablet Take 1 tablet (10 mg total) by mouth at bedtime. 05/14/23   Daphine Deutscher, Mary-Margaret, FNP  pantoprazole (PROTONIX) 40 MG tablet Take 1 tablet (40 mg total) by mouth daily. For stomach 04/08/23   Mechele Claude, MD  Semaglutide-Weight Management [facility]) 0.5 MG/0.5ML SOAJ Inject 0.5 mg into the skin once a week. 08/25/23   Daphine Deutscher, Mary-Margaret, FNP  valACYclovir (VALTREX) 1000 MG tablet SMARTSIG:2  Tablet(s) By Mouth 1-2 Times Daily PRN    [provider]      Allergies    Patient has no known allergies.    Review of Systems   Review of Systems  Constitutional:  Positive for diaphoresis. Negative for chills and fever.  HENT:  Negative for congestion, ear pain and sore throat.   Eyes:  Negative for pain and visual disturbance.  Respiratory:  Negative for cough and shortness of breath.   Cardiovascular:  Negative for chest pain and palpitations.  Gastrointestinal:  Positive for nausea and vomiting. Negative for abdominal pain.  Genitourinary:  Negative for dysuria and hematuria.  Musculoskeletal:  Negative for arthralgias and back pain.  Skin:  Negative for color change and rash.  Neurological:  Negative for seizures and syncope.  All other systems reviewed and are negative.   Physical Exam Updated Vital Signs BP 101/75 (BP Location: Left Arm)   Pulse 83   Temp 97.7 F (36.5 C)   Resp 18   Wt 74.8 kg   SpO2 100%   BMI 30.18 kg/m  Physical Exam Vitals and nursing note reviewed.  Constitutional:      General: She is not in acute distress.    Appearance: Normal appearance. She is well-developed.  HENT:     Head: Normocephalic and atraumatic.     Mouth/Throat:     Mouth: Mucous membranes are dry.  Eyes:     Conjunctiva/sclera: Conjunctivae normal.  Cardiovascular:     Rate and Rhythm: Normal rate and regular rhythm.  Heart sounds: No murmur heard. Pulmonary:     Effort: Pulmonary effort is normal. No respiratory distress.     Breath sounds: Normal breath sounds.  Abdominal:     Palpations: Abdomen is soft.     Tenderness: There is no abdominal tenderness.  Musculoskeletal:        General: No swelling.     Cervical back: Normal range of motion and neck supple.  Skin:    General: Skin is warm and dry.     Capillary Refill: Capillary refill takes less than 2 seconds.  Neurological:     General: No focal deficit present.     Mental Status: She is  alert and oriented to person, place, and time.  Psychiatric:        Mood and Affect: Mood normal.     ED Results / Procedures / Treatments   Labs (all labs ordered are listed, but only abnormal results are displayed) Labs Reviewed  LIPASE, BLOOD - Abnormal; Notable for the following components:      Result Value   Lipase <10 (*)    All other components within normal limits  COMPREHENSIVE METABOLIC PANEL - Abnormal; Notable for the following components:   CO2 19 (*)    Glucose, Bld 139 (*)    All other components within normal limits  CBC - Abnormal; Notable for the following components:   WBC 21.9 (*)    All other components within normal limits  CBG MONITORING, ED - Abnormal; Notable for the following components:   Glucose-Capillary 158 (*)    All other components within normal limits  URINALYSIS, ROUTINE W REFLEX MICROSCOPIC  PREGNANCY, URINE    EKG EKG Interpretation Date/Time:  Wednesday September 09 2023 20:10:20 EST Ventricular Rate:  99 PR Interval:  126 QRS Duration:  84 QT Interval:  326 QTC Calculation: 418 R Axis:   100  Text Interpretation: Normal sinus rhythm Rightward axis Cannot rule out Anterior infarct , age undetermined Abnormal ECG When compared with ECG of 10-Mar-2020 00:29, PREVIOUS ECG IS PRESENT No significant change since last tracing Confirmed by Vanetta Mulders [phone]) on 09/09/2023 9:38:57 PM  Radiology No results found.  Procedures Procedures    Medications Ordered in ED Medications  sodium chloride 0.9 % bolus 1,000 mL (has no administration in time range)  ondansetron (ZOFRAN) injection 4 mg (has no administration in time range)  ondansetron (ZOFRAN-ODT) disintegrating tablet 4 mg (4 mg Oral Given 09/09/23 2007)    ED Course/ Medical Decision Making/ A&P                                 Medical Decision Making Amount and/or Complexity of Data Reviewed Labs: ordered.  Risk Prescription drug management.   Patient symptoms  seem to be suggestive of perhaps an acute onset of viral gastroenteritis.  Will give 1 L of fluid here some antinausea medicine.  Patient's blood sugar was 158 lipase was normal complete metabolic panel liver function test are normal glucose was 139 CO2 down a little bit at 19 white count was 21.9 hemoglobin 14.8.  Patient's vital signs here temp was 97.7 blood pressure 101/75 and pulse was 83.  Will add on differential for the CBC.  Will reassess after fluids and antinausea medicine.  Patient feeling much better after the fluids and the antinausea medicine.  Will give another dose of the Zofran.  The nausea has improved significantly but still has some  slight nausea.  Feel that this is probably a gastroenteritis.  Will provide a prescription for Zofran and ODT.  Differential consistent with absolute neutrophils being elevated.  Feel that has to do with the acute process.  Patient not febrile patient does not seem to have any signs of sepsis.   Final Clinical Impression(s) / ED Diagnoses Final diagnoses:  Gastroenteritis    Rx / DC Orders ED Discharge Orders     None         Vanetta Mulders, MD 09/09/23 2154    Vanetta Mulders, MD 09/09/23 2841    Vanetta Mulders, MD 09/09/23 3244    Vanetta Mulders, MD 09/09/23 2312

## 2023-09-09 NOTE — Discharge Instructions (Signed)
Zofran as needed for nausea vomiting.  When vomiting improves.  Recommend small amounts of clear liquids frequently would recommend something with sugar in it.  Once tolerating that then advance to bland diet.  Suspect this may be a viral gastroenteritis.  Return for any new or worse symptoms.

## 2023-09-09 NOTE — ED Triage Notes (Signed)
N/V, heart racing-began at 1700, diaphoretic, cool to touch. Recent increase of Wegovy.

## 2023-09-11 ENCOUNTER — Ambulatory Visit: Payer: Self-pay | Admitting: Nurse Practitioner

## 2023-09-11 DIAGNOSIS — Z5321 Procedure and treatment not carried out due to patient leaving prior to being seen by health care provider: Secondary | ICD-10-CM | POA: Diagnosis not present

## 2023-09-11 DIAGNOSIS — M791 Myalgia, unspecified site: Secondary | ICD-10-CM | POA: Diagnosis not present

## 2023-09-11 DIAGNOSIS — R531 Weakness: Secondary | ICD-10-CM | POA: Diagnosis not present

## 2023-09-11 DIAGNOSIS — R112 Nausea with vomiting, unspecified: Secondary | ICD-10-CM | POA: Diagnosis not present

## 2023-09-11 MED ORDER — ONDANSETRON HCL 4 MG PO TABS: 4.0000 mg | ORAL_TABLET | Freq: Three times a day (TID) | ORAL | 0 refills | Status: DC | PRN

## 2023-09-11 NOTE — Telephone Encounter (Signed)
Meds ordered this encounter  Medications   ondansetron (ZOFRAN) 4 MG tablet    Sig: Take 1 tablet (4 mg total) by mouth every 8 (eight) hours as needed for nausea or vomiting.    Dispense:  20 tablet    Refill:  0    Supervising Provider:   Arville Care A [[id number]]   Mary-Margaret Daphine Deutscher, FNP

## 2023-09-11 NOTE — Telephone Encounter (Signed)
Copied from CRM [id number]. Topic: Clinical - Red Word Triage >> Sep 11, 2023  9:13 AM Gildardo Pounds wrote: Red Word that prompted transfer to Nurse Triage: still throwing up after ER to [facility] in [place] visit 09/09/2023. nausea, vomiting,   Chief Complaint: vomiting Symptoms: nausea Frequency: ongoing since Tuesday or Wednesday Pertinent Negatives: Patient denies fever or coffee ground emesis Disposition: [x] ED /[] Urgent Care (no appt availability in office) / [] Appointment(In office/virtual)/ []  [facility]/ [] Home Care/ [] Refused Recommended Disposition /[]  Mobile Bus/ []  Follow-up with PCP Additional Notes: The patient reported nausea and vomiting since Tuesday or Wednesday.  She is unable to keep fluid down.  She has not eaten in days.  The patient was seen in the ED 12/18/20224 for nausea and vomiting. She was prescribed Zofran but but it was unhelpful.  She believes this is due to her Wegovy dose increase.  She usually experiences nausea with the injection but has not thrown up except one time when she had been off of the medication for 1 month and restarted due to the medication being on backorder.  She has vomited 6 times or more in a day.  She has been urinating.  Advised the patient to go to the ED for reevaluation.  Routed to pcp as possible medication reaction.  Reason for Disposition  [1] SEVERE vomiting (e.g., 6 or more times/day) AND [2] present > 8 hours (Exception: Patient sounds well, is drinking liquids, does not sound dehydrated, and vomiting has lasted less than 24 hours.)  Answer Assessment - Initial Assessment Questions 1. VOMITING SEVERITY: "How many times have you vomited in the past 24 hours?"     - MILD:  1 - 2 times/day    - MODERATE: 3 - 5 times/day, decreased oral intake without significant weight loss or symptoms of dehydration    - SEVERE: 6 or more times/day, vomits everything or nearly everything, with significant weight loss, symptoms of  dehydration      Every time I drink anything like; haven't eaten in 3-4 days 2. ONSET: "When did the vomiting begin?"      Tuesday or Wednesday  3. FLUIDS: "What fluids or food have you vomited up today?" "Have you been able to keep any fluids down?"     Water  4. ABDOMEN PAIN: "Are your having any abdomen pain?" If Yes : "How bad is it and what does it feel like?" (e.g., crampy, dull, intermittent, constant)      Abdominal soreness from throwing up so often  5. DIARRHEA: "Is there any diarrhea?" If Yes, ask: "How many times today?"      Wednesday - at the hospital twice  6. CONTACTS: "Is there anyone else in the family with the same symptoms?"      None  7. CAUSE: "What do you think is causing your vomiting?"     Medication dosage increase  8. HYDRATION STATUS: "Any signs of dehydration?" (e.g., dry mouth [not only dry lips], too weak to stand) "When did you last urinate?"     Kind of weak Urinated 20-30 minutes ago  9. OTHER SYMPTOMS: "Do you have any other symptoms?" (e.g., fever, headache, vertigo, vomiting blood or coffee grounds, recent head injury)     Nauseated with breathing  Headache last night but not since  Dark green and yellow orange  Protocols used: Vomiting-A-AH

## 2023-09-11 NOTE — Addendum Note (Signed)
Addended by: Bennie Pierini on: 09/11/2023 01:56 PM   Modules accepted: Orders

## 2023-09-12 DIAGNOSIS — Z20822 Contact with and (suspected) exposure to covid-19: Secondary | ICD-10-CM | POA: Diagnosis not present

## 2023-09-12 DIAGNOSIS — B338 Other specified viral diseases: Secondary | ICD-10-CM | POA: Diagnosis not present

## 2023-09-12 DIAGNOSIS — R110 Nausea: Secondary | ICD-10-CM | POA: Diagnosis not present

## 2023-09-25 ENCOUNTER — Encounter: Payer: Self-pay | Admitting: Nurse Practitioner

## 2023-09-25 ENCOUNTER — Ambulatory Visit: Payer: Medicaid Other | Admitting: Nurse Practitioner

## 2023-09-25 VITALS — BP 117/81 | HR 93 | Temp 97.7°F | Ht 62.0 in | Wt 154.0 lb

## 2023-09-25 DIAGNOSIS — Z6829 Body mass index (BMI) 29.0-29.9, adult: Secondary | ICD-10-CM | POA: Diagnosis not present

## 2023-09-25 DIAGNOSIS — F325 Major depressive disorder, single episode, in full remission: Secondary | ICD-10-CM

## 2023-09-25 DIAGNOSIS — F902 Attention-deficit hyperactivity disorder, combined type: Secondary | ICD-10-CM | POA: Diagnosis not present

## 2023-09-25 MED ORDER — LISDEXAMFETAMINE DIMESYLATE 40 MG PO CAPS: 40.0000 mg | ORAL_CAPSULE | ORAL | 0 refills | Status: DC

## 2023-09-25 NOTE — Progress Notes (Signed)
 Subjective:    Patient ID: Kayla Church, female    DOB: [birth year], 23 y.o.   MRN: [id number]   Chief Complaint: ADHD    HPI:  Kayla Church is a 23 y.o. who identifies as a female who was assigned female at birth.   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 Attention deficit hyperactivity disorder (ADHD), combined type Patient is on vyvanse  and is doing well. No medication side effects.  2. Depression, major, single episode, complete remission (HCC) Is on celexa  and is doing well.    09/25/2023    3:28 PM 08/04/2023   12:13 PM 06/26/2023    2:25 PM  Depression screen PHQ 2/9  Decreased Interest 3 0 2  Down, Depressed, Hopeless 1 0 2  PHQ - 2 Score 4 0 4  Altered sleeping 2 0 0  Tired, decreased energy 1 0 1  Change in appetite 0 0 2  Feeling bad or failure about yourself  0 0 1  Trouble concentrating 0 0 0  Moving slowly or fidgety/restless 0 0 2  Suicidal thoughts 0 0 0  PHQ-9 Score 7 0 10  Difficult doing work/chores Somewhat difficult Not difficult at all Somewhat difficult      09/25/2023    3:29 PM 08/04/2023   12:14 PM 06/26/2023    2:26 PM 04/27/2023   11:53 AM  GAD 7 : Generalized Anxiety Score  Nervous, Anxious, on Edge 1 1 1 1   Control/stop worrying 1 0 2 0  Worry too much - different things 1 0 2 0  Trouble relaxing 2 0 2 1  Restless 0 1 0 0  Easily annoyed or irritable 2 0 2 2  Afraid - awful might happen 0 0 0 0  Total GAD 7 Score 7 2 9 4   Anxiety Difficulty Somewhat difficult Somewhat difficult Somewhat difficult Not difficult at all     3. BMI 30.0-30.9 Patient is on wegovy  and is doing well. Weight is down 9 lbs Wt Readings from Last 3 Encounters:  09/25/23 154 lb (69.9 kg)  09/09/23 165 lb (74.8 kg)  08/04/23 161 lb (73 kg)   BMI Readings from Last 3 Encounters:  09/25/23 28.17 kg/m  09/09/23 30.18 kg/m  08/04/23 29.45 kg/m        New complaints: None today  No Known Allergies Outpatient Encounter Medications as of 09/25/2023  Medication Sig   citalopram  (CELEXA ) 40 MG tablet Take 1 tablet (40 mg total) by mouth daily.   fluticasone  (FLONASE ) 50 MCG/ACT nasal spray Place 2 sprays into both nostrils daily.   montelukast  (SINGULAIR ) 10 MG tablet Take 1 tablet (10 mg total) by mouth at bedtime.   ondansetron  (ZOFRAN ) 4 MG tablet Take 1 tablet (4 mg total) by mouth every 8 (eight) hours as needed for nausea or vomiting.   ondansetron  (ZOFRAN -ODT) 4 MG disintegrating tablet Take 1 tablet (4 mg total) by mouth every 8 (eight) hours as needed for nausea or vomiting.   pantoprazole  (PROTONIX ) 40 MG tablet Take 1 tablet (40 mg total) by mouth daily. For stomach   Semaglutide -Weight Management (WEGOVY ) 0.5 MG/0.5ML SOAJ Inject 0.5 mg into the skin once a week.   valACYclovir  (VALTREX ) 1000 MG tablet SMARTSIG:2 Tablet(s) By Mouth 1-2 Times Daily PRN   lisdexamfetamine (VYVANSE ) 40 MG capsule Take 1 capsule (40 mg total) by mouth every morning.  lisdexamfetamine (VYVANSE ) 40 MG capsule Take 1 capsule (40 mg total) by mouth every morning.   lisdexamfetamine (VYVANSE ) 40 MG capsule Take 1 capsule (40 mg total) by mouth every morning.   No facility-administered encounter medications on file as of 09/25/2023.    Past Surgical History:  Procedure Laterality Date   NO PAST SURGERIES      Family History  Problem Relation Age of Onset   Bipolar disorder Father       Controlled substance contract: n/a     Review of Systems  Constitutional:  Negative for diaphoresis.  Eyes:  Negative for pain.  Respiratory:  Negative for shortness of breath.   Cardiovascular:  Negative for chest pain, palpitations and leg swelling.  Gastrointestinal:  Negative for abdominal pain.  Endocrine: Negative for polydipsia.  Skin:  Negative for rash.  Neurological:  Negative for dizziness, weakness and headaches.  Hematological:   Does not bruise/bleed easily.  All other systems reviewed and are negative.      Objective:   Physical Exam Vitals and nursing note reviewed.  Constitutional:      General: She is not in acute distress.    Appearance: Normal appearance. She is well-developed.  Neck:     Vascular: No carotid bruit or JVD.  Cardiovascular:     Rate and Rhythm: Normal rate and regular rhythm.     Heart sounds: Normal heart sounds.  Pulmonary:     Effort: Pulmonary effort is normal. No respiratory distress.     Breath sounds: Normal breath sounds. No wheezing or rales.  Chest:     Chest wall: No tenderness.  Abdominal:     General: Bowel sounds are normal. There is no distension or abdominal bruit.     Palpations: Abdomen is soft. There is no hepatomegaly, splenomegaly, mass or pulsatile mass.     Tenderness: There is no abdominal tenderness.  Musculoskeletal:        General: Normal range of motion.     Cervical back: Normal range of motion and neck supple.  Lymphadenopathy:     Cervical: No cervical adenopathy.  Skin:    General: Skin is warm and dry.  Neurological:     Mental Status: She is alert and oriented to person, place, and time.     Deep Tendon Reflexes: Reflexes are normal and symmetric.  Psychiatric:        Behavior: Behavior normal.        Thought Content: Thought content normal.        Judgment: Judgment normal.    BP 117/81   Pulse 93   Temp 97.7 F (36.5 C) (Temporal)   Ht 5' 2 (1.575 m)   Wt 154 lb (69.9 kg)   SpO2 95%   BMI 28.17 kg/m         Assessment & Plan:   Burdette L Wagar in today with chief complaint of ADHD   1. Attention deficit hyperactivity disorder (ADHD), combined type Continue vyvanse  as prescribed. Meds ordered this encounter  Medications   lisdexamfetamine (VYVANSE ) 40 MG capsule    Sig: Take 1 capsule (40 mg total) by mouth every morning.    Dispense:  30 capsule    Refill:  0    Supervising Provider:   DETTINGER, JOSHUA A [[id number]]    lisdexamfetamine (VYVANSE ) 40 MG capsule    Sig: Take 1 capsule (40 mg total) by mouth every morning.    Dispense:  30 capsule    Refill:  0  Supervising Provider:   DETTINGER, JOSHUA A [[id number]]   lisdexamfetamine (VYVANSE ) 40 MG capsule    Sig: Take 1 capsule (40 mg total) by mouth every morning.    Dispense:  30 capsule    Refill:  0    Supervising Provider:   MARYANNE CHEW A [[id number]]     2. Depression, major, single episode, complete remission (HCC) Stress management Increase celexa  40mg  daily  3. BMI 29.0-29.9,adult Discussed diet and exercise for person with BMI >25 Will recheck weight in 3-6 months Okay to increase to 0.5mg  weekly    The above assessment and management plan was discussed with the patient. The patient verbalized understanding of and has agreed to the management plan. Patient is aware to call the clinic if symptoms persist or worsen. Patient is aware when to return to the clinic for a follow-up visit. Patient educated on when it is appropriate to go to the emergency department.   Mary-Margaret Gladis, FNP

## 2023-09-28 ENCOUNTER — Telehealth: Payer: Self-pay | Admitting: Family Medicine

## 2023-09-28 NOTE — Telephone Encounter (Signed)
 Copied from CRM [id number]. Topic: Clinical - Prescription Issue >> Sep 28, 2023  3:13 PM Carlatta H wrote: Reason for CRM: Patient called stating that [facility] [phone] needs clarification on Semaglutide -Weight Management (WEGOVY ) 0.5 MG/0.5ML SOAJ [[id number]]//Please call

## 2023-09-29 NOTE — Telephone Encounter (Signed)
 Called and spoke with pharmacy and answered all questions and concerns.

## 2023-10-15 ENCOUNTER — Emergency Department (HOSPITAL_BASED_OUTPATIENT_CLINIC_OR_DEPARTMENT_OTHER): Payer: Medicaid Other

## 2023-10-15 ENCOUNTER — Encounter (HOSPITAL_BASED_OUTPATIENT_CLINIC_OR_DEPARTMENT_OTHER): Payer: Self-pay | Admitting: Emergency Medicine

## 2023-10-15 ENCOUNTER — Emergency Department (HOSPITAL_BASED_OUTPATIENT_CLINIC_OR_DEPARTMENT_OTHER)
Admission: EM | Admit: 2023-10-15 | Discharge: 2023-10-15 | Disposition: A | Discharge status: Home / Self Care | Payer: Medicaid Other | Attending: Emergency Medicine | Admitting: Emergency Medicine

## 2023-10-15 ENCOUNTER — Other Ambulatory Visit: Payer: Self-pay

## 2023-10-15 DIAGNOSIS — F1290 Cannabis use, unspecified, uncomplicated: Secondary | ICD-10-CM | POA: Insufficient documentation

## 2023-10-15 DIAGNOSIS — R197 Diarrhea, unspecified: Secondary | ICD-10-CM | POA: Diagnosis not present

## 2023-10-15 DIAGNOSIS — N281 Cyst of kidney, acquired: Secondary | ICD-10-CM | POA: Diagnosis not present

## 2023-10-15 DIAGNOSIS — R1013 Epigastric pain: Secondary | ICD-10-CM | POA: Insufficient documentation

## 2023-10-15 DIAGNOSIS — R112 Nausea with vomiting, unspecified: Secondary | ICD-10-CM | POA: Insufficient documentation

## 2023-10-15 DIAGNOSIS — D72829 Elevated white blood cell count, unspecified: Secondary | ICD-10-CM | POA: Diagnosis not present

## 2023-10-15 DIAGNOSIS — N83202 Unspecified ovarian cyst, left side: Secondary | ICD-10-CM | POA: Diagnosis not present

## 2023-10-15 LAB — CBC
HCT: 41.3 % (ref 36.0–46.0)
Hemoglobin: 13.8 g/dL (ref 12.0–15.0)
MCH: 30.4 pg (ref 26.0–34.0)
MCHC: 33.4 g/dL (ref 30.0–36.0)
MCV: 91 fL (ref 80.0–100.0)
Platelets: 228 10*3/uL (ref 150–400)
RBC: 4.54 MIL/uL (ref 3.87–5.11)
RDW: 12.8 % (ref 11.5–15.5)
WBC: 17.1 10*3/uL — ABNORMAL HIGH (ref 4.0–10.5)
nRBC: 0 % (ref 0.0–0.2)

## 2023-10-15 LAB — RAPID URINE DRUG SCREEN, HOSP PERFORMED
Amphetamines: NOT DETECTED
Barbiturates: NOT DETECTED
Benzodiazepines: NOT DETECTED
Cocaine: NOT DETECTED
Opiates: NOT DETECTED
Tetrahydrocannabinol: POSITIVE — AB

## 2023-10-15 LAB — LIPASE, BLOOD: Lipase: <10 U/L — ABNORMAL LOW (ref 11–51)

## 2023-10-15 LAB — COMPREHENSIVE METABOLIC PANEL
ALT: 15 U/L (ref 0–44)
AST: 14 U/L — ABNORMAL LOW (ref 15–41)
Albumin: 4.7 g/dL (ref 3.5–5.0)
Alkaline Phosphatase: 53 U/L (ref 38–126)
Anion gap: 10 (ref 5–15)
BUN: 8 mg/dL (ref 6–20)
CO2: 22 mmol/L (ref 22–32)
Calcium: 9.5 mg/dL (ref 8.9–10.3)
Chloride: 109 mmol/L (ref 98–111)
Creatinine, Ser: 0.7 mg/dL (ref 0.44–1.00)
GFR, Estimated: >60 mL/min (ref >60)
Glucose, Bld: 165 mg/dL — ABNORMAL HIGH (ref 70–99)
Potassium: 3.8 mmol/L (ref 3.5–5.1)
Sodium: 141 mmol/L (ref 135–145)
Total Bilirubin: 0.6 mg/dL (ref 0.0–1.2)
Total Protein: 6.8 g/dL (ref 6.5–8.1)

## 2023-10-15 LAB — URINALYSIS, ROUTINE W REFLEX MICROSCOPIC
Bacteria, UA: NONE SEEN
Bilirubin Urine: NEGATIVE
Glucose, UA: NEGATIVE mg/dL
Ketones, ur: >80 mg/dL — AB
Leukocytes,Ua: NEGATIVE
Nitrite: NEGATIVE
RBC / HPF: >50 RBC/hpf (ref 0–5)
Specific Gravity, Urine: 1.031 — ABNORMAL HIGH (ref 1.005–1.030)
pH: 8 (ref 5.0–8.0)

## 2023-10-15 LAB — PREGNANCY, URINE: Preg Test, Ur: NEGATIVE

## 2023-10-15 MED ORDER — ONDANSETRON HCL 4 MG/2ML IJ SOLN
INTRAMUSCULAR | Status: AC
  Filled 2023-10-15: qty 2

## 2023-10-15 MED ORDER — ONDANSETRON HCL 4 MG PO TABS: 4.0000 mg | ORAL_TABLET | Freq: Three times a day (TID) | ORAL | 0 refills | Status: DC | PRN

## 2023-10-15 MED ORDER — ACETAMINOPHEN 500 MG PO TABS: 500.0000 mg | ORAL_TABLET | Freq: Four times a day (QID) | ORAL | 0 refills | Status: AC | PRN

## 2023-10-15 MED ORDER — IOHEXOL 300 MG/ML  SOLN
100.0000 mL | Freq: Once | INTRAMUSCULAR | Status: AC | PRN
  Administered 2023-10-15: 85 mL via INTRAVENOUS

## 2023-10-15 MED ORDER — ONDANSETRON HCL 4 MG/2ML IJ SOLN
4.0000 mg | Freq: Once | INTRAMUSCULAR | Status: AC | PRN
  Administered 2023-10-15: 4 mg via INTRAVENOUS

## 2023-10-15 MED ORDER — SODIUM CHLORIDE 0.9 % IV BOLUS
1000.0000 mL | Freq: Once | INTRAVENOUS | Status: AC
  Administered 2023-10-15: 1000 mL via INTRAVENOUS

## 2023-10-15 MED ORDER — ONDANSETRON HCL 4 MG/2ML IJ SOLN
4.0000 mg | Freq: Once | INTRAMUSCULAR | Status: AC
  Administered 2023-10-15: 4 mg via INTRAVENOUS
  Filled 2023-10-15: qty 2

## 2023-10-15 MED ORDER — MORPHINE SULFATE (PF) 4 MG/ML IV SOLN
4.0000 mg | Freq: Once | INTRAVENOUS | Status: DC
  Filled 2023-10-15: qty 1

## 2023-10-15 NOTE — ED Notes (Signed)
Transport to ct

## 2023-10-15 NOTE — Discharge Instructions (Addendum)
You have been evaluated for your symptoms.  Fortunately no concerning findings were noted on today's exam.  Your symptom may be related to marijuana use.  Please avoid marijuana use as it may worsen your symptoms.  You will take Zofran as needed for nausea, take hot bath, and stay hydrated.  Return if you have any concern.

## 2023-10-15 NOTE — ED Provider Notes (Signed)
Kayla Church AT [facility] Provider Note   CSN: [id number] Arrival date & time: 10/15/23  1136     History  Chief Complaint  Patient presents with   Emesis    Kayla Church is a 23 y.o. female.  The history is provided by the patient and medical records. No language interpreter was used.  Emesis    23 year old female significant history of polysubstance use, ADHD presenting with complaint of abdominal discomfort.  Patient was awoke this morning with severe nausea, has vomited as well as having several bouts of loose stools.  She complaining of abdominal cramping as well.  Symptoms moderate in severity, she feels very dehydrated.  She does not endorse any fever but does endorse some chills.  No runny nose sneezing or coughing no chest pain or shortness of breath no urinary symptoms.  She reports 1 similar episode 2 weeks ago but was diagnosed with RSV.  She does not have any RSV symptoms currently.  She denies any recent sick contact.  Last marijuana use was 2 weeks ago.  History of gallstone pancreatitis in the past status post cholecystectomy.  Home Medications Prior to Admission medications   Medication Sig Start Date End Date Taking? Authorizing Provider  Norethindrone-Ethinyl Estradiol-Fe Biphas (LO LOESTRIN FE) 1 MG-10 MCG / 10 MCG tablet Take 1 tablet by mouth daily. 12/04/22  Yes [provider]  citalopram (CELEXA) 40 MG tablet Take 1 tablet (40 mg total) by mouth daily. 08/04/23   Daphine Deutscher, Mary-Margaret, FNP  fluticasone (FLONASE) 50 MCG/ACT nasal spray Place 2 sprays into both nostrils daily. 07/21/22   Daphine Deutscher, Mary-Margaret, FNP  lisdexamfetamine (VYVANSE) 40 MG capsule Take 1 capsule (40 mg total) by mouth every morning. 09/25/23 10/25/23  Bennie Pierini, FNP  lisdexamfetamine (VYVANSE) 40 MG capsule Take 1 capsule (40 mg total) by mouth every morning. 10/25/23 11/24/23  Daphine Deutscher Mary-Margaret, FNP  lisdexamfetamine (VYVANSE) 40 MG capsule  Take 1 capsule (40 mg total) by mouth every morning. 11/24/23 12/24/23  Daphine Deutscher, Mary-Margaret, FNP  montelukast (SINGULAIR) 10 MG tablet Take 1 tablet (10 mg total) by mouth at bedtime. 05/14/23   Daphine Deutscher, Mary-Margaret, FNP  ondansetron (ZOFRAN) 4 MG tablet Take 1 tablet (4 mg total) by mouth every 8 (eight) hours as needed for nausea or vomiting. 09/11/23   Daphine Deutscher, Mary-Margaret, FNP  ondansetron (ZOFRAN-ODT) 4 MG disintegrating tablet Take 1 tablet (4 mg total) by mouth every 8 (eight) hours as needed for nausea or vomiting. 09/09/23   Vanetta Mulders, MD  pantoprazole (PROTONIX) 40 MG tablet Take 1 tablet (40 mg total) by mouth daily. For stomach 04/08/23   Mechele Claude, MD  Semaglutide-Weight Management [facility]) 0.5 MG/0.5ML SOAJ Inject 0.5 mg into the skin once a week. 08/25/23   Daphine Deutscher, Mary-Margaret, FNP  valACYclovir (VALTREX) 1000 MG tablet SMARTSIG:2 Tablet(s) By Mouth 1-2 Times Daily PRN    [provider]      Allergies    Patient has no known allergies.    Review of Systems   Review of Systems  Gastrointestinal:  Positive for vomiting.  All other systems reviewed and are negative.   Physical Exam Updated Vital Signs BP 130/86 (BP Location: Right Arm)   Pulse 69   Temp 97.8 F (36.6 C)   Resp 18   Wt 66.7 kg   LMP 09/17/2023   SpO2 100%   BMI 26.89 kg/m  Physical Exam Vitals and nursing note reviewed.  Constitutional:      General: She is not  in acute distress.    Appearance: She is well-developed.  HENT:     Head: Atraumatic.  Eyes:     Conjunctiva/sclera: Conjunctivae normal.  Cardiovascular:     Rate and Rhythm: Normal rate and regular rhythm.     Pulses: Normal pulses.     Heart sounds: Normal heart sounds.  Pulmonary:     Effort: Pulmonary effort is normal.     Breath sounds: No wheezing, rhonchi or rales.  Abdominal:     Palpations: Abdomen is soft.     Tenderness: There is abdominal tenderness (Mild epigastric tenderness no guarding no rebound  tenderness negative Murphy sign no pain at McBurney's point.).  Musculoskeletal:     Cervical back: Neck supple.  Skin:    Findings: No rash.  Neurological:     Mental Status: She is alert.  Psychiatric:        Mood and Affect: Mood normal.     ED Results / Procedures / Treatments   Labs (all labs ordered are listed, but only abnormal results are displayed) Labs Reviewed  LIPASE, BLOOD - Abnormal; Notable for the following components:      Result Value   Lipase <10 (*)    All other components within normal limits  COMPREHENSIVE METABOLIC PANEL - Abnormal; Notable for the following components:   Glucose, Bld 165 (*)    AST 14 (*)    All other components within normal limits  CBC - Abnormal; Notable for the following components:   WBC 17.1 (*)    All other components within normal limits  URINALYSIS, ROUTINE W REFLEX MICROSCOPIC - Abnormal; Notable for the following components:   Specific Gravity, Urine 1.031 (*)    Hgb urine dipstick MODERATE (*)    Ketones, ur >80 (*)    Protein, ur TRACE (*)    All other components within normal limits  RAPID URINE DRUG SCREEN, HOSP PERFORMED - Abnormal; Notable for the following components:   Tetrahydrocannabinol POSITIVE (*)    All other components within normal limits  PREGNANCY, URINE    EKG None  Radiology CT ABDOMEN PELVIS W CONTRAST Result Date: 10/15/2023 CLINICAL DATA:  Nausea, vomiting and diarrhea. EXAM: CT ABDOMEN AND PELVIS WITH CONTRAST TECHNIQUE: Multidetector CT imaging of the abdomen and pelvis was performed using the standard protocol following bolus administration of intravenous contrast. RADIATION DOSE REDUCTION: This exam was performed according to the departmental dose-optimization program which includes automated exposure control, adjustment of the mA and/or kV according to patient size and/or use of iterative reconstruction technique. CONTRAST:  85mL OMNIPAQUE IOHEXOL 300 MG/ML  SOLN COMPARISON:  None Available.  FINDINGS: Lower chest: No acute abnormality. Hepatobiliary: No focal liver abnormality is seen. Status post cholecystectomy. No biliary dilatation. Pancreas: Unremarkable. No pancreatic ductal dilatation or surrounding inflammatory changes. Spleen: Normal in size without focal abnormality. Adrenals/Urinary Tract: Adrenal glands are unremarkable. Kidneys are normal, without renal calculi, focal lesion, or hydronephrosis. Bladder is unremarkable. Stomach/Bowel: Stomach is within normal limits. Appendix appears normal. No evidence of bowel wall thickening, distention, or inflammatory changes. Vascular/Lymphatic: No significant vascular findings are present. No enlarged abdominal or pelvic lymph nodes. Reproductive: Uterus and right adnexa are unremarkable. A 10 mm diameter simple cyst is seen within the left kidney. Other: No abdominal wall hernia or abnormality. No abdominopelvic ascites. Musculoskeletal: No acute or significant osseous findings. IMPRESSION: 1. Evidence of prior cholecystectomy. 2. Small left ovarian cyst, likely benign. No follow-up imaging is recommended. This recommendation follows ACR consensus guidelines: White Paper  of the ACR Incidental Findings Committee II on Adnexal Findings. J Am Coll Radiol [phone]. Electronically Signed   By: Aram Candela M.D.   On: 10/15/2023 17:12    Procedures Procedures    Medications Ordered in ED Medications  morphine (PF) 4 MG/ML injection 4 mg (4 mg Intravenous Not Given 10/15/23 1358)  ondansetron (ZOFRAN) injection 4 mg (4 mg Intravenous Given 10/15/23 1343)  ondansetron (ZOFRAN) injection 4 mg (0 mg Intravenous Hold 10/15/23 1904)  sodium chloride 0.9 % bolus 1,000 mL (0 mLs Intravenous Stopped 10/15/23 1443)  iohexol (OMNIPAQUE) 300 MG/ML solution 100 mL (85 mLs Intravenous Contrast Given 10/15/23 1655)    ED Course/ Medical Decision Making/ A&P                                 Medical Decision Making Amount and/or Complexity of  Data Reviewed Labs: ordered. Radiology: ordered.  Risk Prescription drug management.   BP 130/86 (BP Location: Right Arm)   Pulse 69   Temp 97.8 F (36.6 C)   Resp 18   Wt 66.7 kg   LMP 09/17/2023   SpO2 100%   BMI 26.89 kg/m   52:62 PM  23 year old female significant history of polysubstance use, ADHD presenting with complaint of abdominal discomfort.  Patient was awoke this morning with severe nausea, has vomited as well as having several bouts of loose stools.  She complaining of abdominal cramping as well.  Symptoms moderate in severity, she feels very dehydrated.  She does not endorse any fever but does endorse some chills.  No runny nose sneezing or coughing no chest pain or shortness of breath no urinary symptoms.  She reports 1 similar episode 2 weeks ago but was diagnosed with RSV.  She does not have any RSV symptoms currently.  She denies any recent sick contact.  Last marijuana use was 2 weeks ago.  History of gallstone pancreatitis in the past status post cholecystectomy.  Exam notable for mild epigastric tenderness but no peritoneal sign.  Heart with normal rate rhythm lungs are clear to auscultation bilaterally  Vital sign overall reassuring.  -Labs ordered, independently viewed and interpreted by me.  Labs remarkable for UDS positive for THC.  WBC 17.1.  UA showing >80 ketones suggestive of dehydration.  Hgb moderate likely 2/2 recent menstruation. Preg test negative -The patient was maintained on a cardiac monitor.  I personally viewed and interpreted the cardiac monitored which showed an underlying rhythm of: NSR -Imaging independently viewed and interpreted by me and I agree with radiologist's interpretation.  Result remarkable for abd/pelvis CT without acute finding. Small left ovarian cyst were noted -This patient presents to the ED for concern of abd pain, this involves an extensive number of treatment options, and is a complaint that carries with it a high risk of  complications and morbidity.  The differential diagnosis includes CHS, gastroenteritis, colitis, diverticulitis, pancreatitis, appendicitis, viral illness -Co morbidities that complicate the patient evaluation includes polysubstance use, ADHD -Treatment includes zofran, morphine, NS -Reevaluation of the patient after these medicines showed that the patient improved -PCP office notes or outside notes reviewed -Escalation to admission/observation considered: patients feels much better, is comfortable with discharge, and will follow up with PCP -Prescription medication considered, patient comfortable with zofran and tylenol -Social Determinant of Health considered which includes tobacco use         Final Clinical Impression(s) / ED Diagnoses Final diagnoses:  Nausea  vomiting and diarrhea    Rx / DC Orders ED Discharge Orders          Ordered    ondansetron (ZOFRAN) 4 MG tablet  Every 8 hours PRN        10/15/23 1921    acetaminophen (TYLENOL) 500 MG tablet  Every 6 hours PRN        10/15/23 1921              Fayrene Helper, PA-C 10/15/23 Mylinda Latina, MD 10/16/23 [phone]

## 2023-10-15 NOTE — ED Triage Notes (Signed)
Pt c/o n/v/d today since 0400. 2 weeks since last marijuana use

## 2023-10-16 ENCOUNTER — Encounter (HOSPITAL_COMMUNITY): Payer: Self-pay | Admitting: *Deleted

## 2023-10-16 ENCOUNTER — Ambulatory Visit: Payer: Self-pay | Admitting: Nurse Practitioner

## 2023-10-16 ENCOUNTER — Emergency Department (HOSPITAL_COMMUNITY)
Admission: EM | Admit: 2023-10-16 | Discharge: 2023-10-16 | Disposition: A | Discharge status: Home / Self Care | Payer: Medicaid Other | Attending: Emergency Medicine | Admitting: Emergency Medicine

## 2023-10-16 ENCOUNTER — Other Ambulatory Visit: Payer: Self-pay

## 2023-10-16 DIAGNOSIS — R112 Nausea with vomiting, unspecified: Secondary | ICD-10-CM | POA: Insufficient documentation

## 2023-10-16 DIAGNOSIS — I959 Hypotension, unspecified: Secondary | ICD-10-CM | POA: Diagnosis not present

## 2023-10-16 DIAGNOSIS — R109 Unspecified abdominal pain: Secondary | ICD-10-CM | POA: Diagnosis not present

## 2023-10-16 DIAGNOSIS — R110 Nausea: Secondary | ICD-10-CM | POA: Diagnosis not present

## 2023-10-16 DIAGNOSIS — R1111 Vomiting without nausea: Secondary | ICD-10-CM | POA: Diagnosis not present

## 2023-10-16 LAB — CBC WITH DIFFERENTIAL/PLATELET
Abs Immature Granulocytes: 0.04 10*3/uL (ref 0.00–0.07)
Basophils Absolute: 0 10*3/uL (ref 0.0–0.1)
Basophils Relative: 0 %
Eosinophils Absolute: 0.1 10*3/uL (ref 0.0–0.5)
Eosinophils Relative: 0 %
HCT: 40.3 % (ref 36.0–46.0)
Hemoglobin: 13 g/dL (ref 12.0–15.0)
Immature Granulocytes: 0 %
Lymphocytes Relative: 25 %
Lymphs Abs: 3.1 10*3/uL (ref 0.7–4.0)
MCH: 29.8 pg (ref 26.0–34.0)
MCHC: 32.3 g/dL (ref 30.0–36.0)
MCV: 92.4 fL (ref 80.0–100.0)
Monocytes Absolute: 0.7 10*3/uL (ref 0.1–1.0)
Monocytes Relative: 5 %
Neutro Abs: 8.6 10*3/uL — ABNORMAL HIGH (ref 1.7–7.7)
Neutrophils Relative %: 70 %
Platelets: 223 10*3/uL (ref 150–400)
RBC: 4.36 MIL/uL (ref 3.87–5.11)
RDW: 12.7 % (ref 11.5–15.5)
WBC: 12.5 10*3/uL — ABNORMAL HIGH (ref 4.0–10.5)
nRBC: 0 % (ref 0.0–0.2)

## 2023-10-16 LAB — COMPREHENSIVE METABOLIC PANEL
ALT: 19 U/L (ref 0–44)
AST: 21 U/L (ref 15–41)
Albumin: 4.1 g/dL (ref 3.5–5.0)
Alkaline Phosphatase: 41 U/L (ref 38–126)
Anion gap: 12 (ref 5–15)
BUN: 7 mg/dL (ref 6–20)
CO2: 22 mmol/L (ref 22–32)
Calcium: 9 mg/dL (ref 8.9–10.3)
Chloride: 105 mmol/L (ref 98–111)
Creatinine, Ser: 0.65 mg/dL (ref 0.44–1.00)
GFR, Estimated: >60 mL/min (ref >60)
Glucose, Bld: 99 mg/dL (ref 70–99)
Potassium: 3.4 mmol/L — ABNORMAL LOW (ref 3.5–5.1)
Sodium: 139 mmol/L (ref 135–145)
Total Bilirubin: 0.6 mg/dL (ref 0.0–1.2)
Total Protein: 6.8 g/dL (ref 6.5–8.1)

## 2023-10-16 LAB — LIPASE, BLOOD: Lipase: 24 U/L (ref 11–51)

## 2023-10-16 MED ORDER — LORAZEPAM 2 MG/ML IJ SOLN
0.5000 mg | Freq: Once | INTRAMUSCULAR | Status: AC
  Administered 2023-10-16: 0.5 mg via INTRAVENOUS
  Filled 2023-10-16: qty 1

## 2023-10-16 MED ORDER — ONDANSETRON HCL 4 MG/2ML IJ SOLN
4.0000 mg | Freq: Once | INTRAMUSCULAR | Status: AC
  Administered 2023-10-16: 4 mg via INTRAVENOUS
  Filled 2023-10-16: qty 2

## 2023-10-16 MED ORDER — SODIUM CHLORIDE 0.9 % IV BOLUS
1000.0000 mL | Freq: Once | INTRAVENOUS | Status: AC
  Administered 2023-10-16: 1000 mL via INTRAVENOUS

## 2023-10-16 NOTE — Telephone Encounter (Signed)
Copied from CRM [id number]. Topic: Clinical - Red Word Triage >> Oct 16, 2023  9:41 AM Dennison Nancy wrote: Red Word that prompted transfer to Nurse Triage: want to know if office have iv fluid reason dehydrated ,very pale ,weak  can not hold liquid  Was seen at the hospital yesterday and have a cyst on her ovaries   Chief Complaint: ER Follow Up- Symptoms: Pallor, Weakness, Vomiting Frequency: Acute Pertinent Negatives: Patient denies chest pain,  Disposition: [x] ED /[] Urgent Care (no appt availability in office) / [] Appointment(In office/virtual)/ []  [facility]/ [] Home Care/ [] Refused Recommended Disposition /[] [facility]/ []  Follow-up with PCP Additional Notes: CB is a 23 year old female being triaged for weakness and signs of dehydration. Recent ED Visit on yesterday. Patient is not able to keep anything down, reports a decrease in urination patterns. As well as a dry mouth and dry lips. Patient is vomiting frequently, and once during telephonic triage. Reports it to be clear and bile-like. Referred to ER, patient states she will need to see if she can get a ride to go to the ER, but agreed to disposition.   Please Note: Patient may need assistance getting to the ER, follow up with patient regarding arrival to advised disposition.   Reason for Disposition  [1] Drinking very little AND [2] dehydration suspected (e.g., no urine > 12 hours, very dry mouth, very lightheaded)  Answer Assessment - Initial Assessment Questions 1. VOMITING SEVERITY: "How many times have you vomited in the past 24 hours?"     - MILD:  1 - 2 times/day    - MODERATE: 3 - 5 times/day, decreased oral intake without significant weight loss or symptoms of dehydration    - SEVERE: 6 or more times/day, vomits everything or nearly everything, with significant weight loss, symptoms of dehydration      4-5 Times  2. ONSET: "When did the vomiting begin?"      Yesterday  3. FLUIDS: "What fluids or food  have you vomited up today?" "Have you been able to keep any fluids down?"     Unable to keep fluids down  4. ABDOMEN PAIN: "Are your having any abdomen pain?" If Yes : "How bad is it and what does it feel like?" (e.g., crampy, dull, intermittent, constant)      No  5. DIARRHEA: "Is there any diarrhea?" If Yes, ask: "How many times today?"      One episode  6. CONTACTS: "Is there anyone else in the family with the same symptoms?"      No  7. CAUSE: "What do you think is causing your vomiting?"     Unable to obtain Zofran  8. HYDRATION STATUS: "Any signs of dehydration?" (e.g., dry mouth [not only dry lips], too weak to stand) "When did you last urinate?"     Weakness, Dry Mouth, Dry Lips  9. OTHER SYMPTOMS: "Do you have any other symptoms?" (e.g., fever, headache, vertigo, vomiting blood or coffee grounds, recent head injury)     Headache, Urination Frequency-Less  10. PREGNANCY: "Is there any chance you are pregnant?" "When was your last menstrual period?"       Started today.  Protocols used: Vomiting-A-AH

## 2023-10-16 NOTE — Discharge Instructions (Signed)
Frequent small sips of clear fluids tonight, bland diet as tolerated starting tomorrow.  Try to get your Zofran prescription filled you may take this as directed if needed for nausea vomiting.  Please follow-up with your primary care provider for recheck.

## 2023-10-16 NOTE — ED Triage Notes (Signed)
Pt BIB [facility] for emesis and abd pain at times since yesterday.  Denies diarrhea today but had some yesterday. Seen for same at [facility] yesterday.  Pt states she has been using some weight loss shots-Wegovy.

## 2023-10-17 DIAGNOSIS — F909 Attention-deficit hyperactivity disorder, unspecified type: Secondary | ICD-10-CM | POA: Diagnosis not present

## 2023-10-17 DIAGNOSIS — R10816 Epigastric abdominal tenderness: Secondary | ICD-10-CM | POA: Diagnosis not present

## 2023-10-17 DIAGNOSIS — R059 Cough, unspecified: Secondary | ICD-10-CM | POA: Diagnosis not present

## 2023-10-17 DIAGNOSIS — Z1152 Encounter for screening for COVID-19: Secondary | ICD-10-CM | POA: Diagnosis not present

## 2023-10-17 DIAGNOSIS — R112 Nausea with vomiting, unspecified: Secondary | ICD-10-CM | POA: Diagnosis not present

## 2023-10-17 DIAGNOSIS — Z720 Tobacco use: Secondary | ICD-10-CM | POA: Diagnosis not present

## 2023-10-17 DIAGNOSIS — K219 Gastro-esophageal reflux disease without esophagitis: Secondary | ICD-10-CM | POA: Diagnosis not present

## 2023-10-19 NOTE — ED Provider Notes (Signed)
[place] EMERGENCY DEPARTMENT AT [facility] Provider Note   CSN: [id number] Arrival date & time: 10/16/23  1156     History  Chief Complaint  Patient presents with   Emesis    Kayla Church is a 23 y.o. female.   Emesis Associated symptoms: abdominal pain   Associated symptoms: no arthralgias, no diarrhea, no fever and no headaches        Kayla Church is a 23 y.o. female who presents to the Emergency Department complaining of persistent vomiting and abdominal cramping.  Symptoms have been present for few days.  She was seen 1 day prior to this ER visit for similar symptoms.  She had CT of the abdomen and pelvis at that time without acute finding.  Discharged with prescription for Zofran.  Returns to the emergency department due to continued vomiting.  States she feels dehydrated.  She is concerned that her current symptoms are more related to her recent use of Wegovy.  She also admits to marijuana use.  Denies any worsening abdominal pain or fever.  Home Medications Prior to Admission medications   Medication Sig Start Date End Date Taking? Authorizing Provider  acetaminophen (TYLENOL) 500 MG tablet Take 1 tablet (500 mg total) by mouth every 6 (six) hours as needed. 10/15/23   Fayrene Helper, PA-C  citalopram (CELEXA) 40 MG tablet Take 1 tablet (40 mg total) by mouth daily. 08/04/23   Daphine Deutscher, Mary-Margaret, FNP  fluticasone (FLONASE) 50 MCG/ACT nasal spray Place 2 sprays into both nostrils daily. 07/21/22   Daphine Deutscher, Mary-Margaret, FNP  lisdexamfetamine (VYVANSE) 40 MG capsule Take 1 capsule (40 mg total) by mouth every morning. 09/25/23 10/25/23  Bennie Pierini, FNP  lisdexamfetamine (VYVANSE) 40 MG capsule Take 1 capsule (40 mg total) by mouth every morning. 10/25/23 11/24/23  Daphine Deutscher Mary-Margaret, FNP  lisdexamfetamine (VYVANSE) 40 MG capsule Take 1 capsule (40 mg total) by mouth every morning. 11/24/23 12/24/23  Daphine Deutscher, Mary-Margaret, FNP  montelukast (SINGULAIR) 10 MG  tablet Take 1 tablet (10 mg total) by mouth at bedtime. 05/14/23   Daphine Deutscher Mary-Margaret, FNP  Norethindrone-Ethinyl Estradiol-Fe Biphas (LO LOESTRIN FE) 1 MG-10 MCG / 10 MCG tablet Take 1 tablet by mouth daily. 12/04/22   [provider]  ondansetron (ZOFRAN) 4 MG tablet Take 1 tablet (4 mg total) by mouth every 8 (eight) hours as needed for nausea or vomiting. 10/15/23   Fayrene Helper, PA-C  ondansetron (ZOFRAN-ODT) 4 MG disintegrating tablet Take 1 tablet (4 mg total) by mouth every 8 (eight) hours as needed for nausea or vomiting. 09/09/23   Vanetta Mulders, MD  pantoprazole (PROTONIX) 40 MG tablet Take 1 tablet (40 mg total) by mouth daily. For stomach 04/08/23   Mechele Claude, MD  Semaglutide-Weight Management [facility]) 0.5 MG/0.5ML SOAJ Inject 0.5 mg into the skin once a week. 08/25/23   Daphine Deutscher, Mary-Margaret, FNP  valACYclovir (VALTREX) 1000 MG tablet SMARTSIG:2 Tablet(s) By Mouth 1-2 Times Daily PRN    [provider]      Allergies    Patient has no known allergies.    Review of Systems   Review of Systems  Constitutional:  Positive for appetite change. Negative for fever.  Respiratory:  Negative for shortness of breath.   Cardiovascular:  Negative for chest pain.  Gastrointestinal:  Positive for abdominal pain, nausea and vomiting. Negative for diarrhea.  Genitourinary:  Negative for difficulty urinating and dysuria.  Musculoskeletal:  Negative for arthralgias.  Neurological:  Negative for dizziness, syncope, weakness and headaches.  Psychiatric/Behavioral:  Negative for confusion.     Physical Exam Updated Vital Signs BP 118/69   Pulse 75   Temp 98 F (36.7 C) (Oral)   Resp 16   Ht 5\' 2"  (1.575 m)   Wt 66.7 kg   LMP 10/16/2023   SpO2 98%   BMI 26.89 kg/m  Physical Exam Vitals and nursing note reviewed.  Constitutional:      General: She is not in acute distress.    Appearance: Normal appearance. She is not ill-appearing or toxic-appearing.  HENT:      Mouth/Throat:     Mouth: Mucous membranes are dry.  Eyes:     Conjunctiva/sclera: Conjunctivae normal.  Cardiovascular:     Rate and Rhythm: Normal rate and regular rhythm.     Pulses: Normal pulses.  Pulmonary:     Effort: Pulmonary effort is normal.  Abdominal:     Palpations: Abdomen is soft.     Tenderness: There is no abdominal tenderness.  Musculoskeletal:        General: Normal range of motion.  Skin:    General: Skin is warm.     Capillary Refill: Capillary refill takes less than 2 seconds.  Neurological:     General: No focal deficit present.     Mental Status: She is alert.     Motor: No weakness.     ED Results / Procedures / Treatments   Labs (all labs ordered are listed, but only abnormal results are displayed) Labs Reviewed  CBC WITH DIFFERENTIAL/PLATELET - Abnormal; Notable for the following components:      Result Value   WBC 12.5 (*)    Neutro Abs 8.6 (*)    All other components within normal limits  COMPREHENSIVE METABOLIC PANEL - Abnormal; Notable for the following components:   Potassium 3.4 (*)    All other components within normal limits  LIPASE, BLOOD    EKG None  Radiology No results found.  Procedures Procedures    Medications Ordered in ED Medications  sodium chloride 0.9 % bolus 1,000 mL (0 mLs Intravenous Stopped 10/16/23 1639)  ondansetron (ZOFRAN) injection 4 mg (4 mg Intravenous Given 10/16/23 1424)  LORazepam (ATIVAN) injection 0.5 mg (0.5 mg Intravenous Given 10/16/23 1638)    ED Course/ Medical Decision Making/ A&P                                 Medical Decision Making Patient here due to persistent nausea vomiting and concern for dehydration.  She was evaluated 1 day prior to this visit at [facility].  Had CT abdomen and pelvis at that time without acute finding.  Discharged with prescription for Zofran.  No reported fever no worsening abdominal pain  Vital signs are reassuring.  Patient appears nontoxic.  No  peritoneal signs on abdominal exam.  Mucous membranes are slightly dry.  I suspect symptoms are probably viral, may also be related to cannabinoid hyperemesis.  I doubt acute surgical abdomen.  Will give antiemetic IV fluids and reassess.  Anticipate discharge home.  Amount and/or Complexity of Data Reviewed Labs: ordered.    Details: Patient was noted to have significant leukocytosis on previous ER visit.  Much improved today.  Chemistries without derangement Discussion of management or test interpretation with external provider(s): No indication for need of repeat imaging today.  Labs overall reassuring.  Patient given IV fluids antiemetic and Ativan.  Reports feeling better.  Nausea  vomiting may be related to cannabinoid hyperemesis.  She is tolerating oral fluids, no further vomiting.  Appears appropriate for discharge home, all questions were answered.  Will follow-up with PCP for recheck.  Return precautions were also given.  Risk Prescription drug management.           Final Clinical Impression(s) / ED Diagnoses Final diagnoses:  Nausea and vomiting, unspecified vomiting type    Rx / DC Orders ED Discharge Orders     None         Rosey Bath 10/19/23 1528    Gerhard Munch, MD 10/21/23 1115

## 2023-10-28 ENCOUNTER — Other Ambulatory Visit: Payer: Self-pay | Admitting: Nurse Practitioner

## 2023-10-28 DIAGNOSIS — F325 Major depressive disorder, single episode, in full remission: Secondary | ICD-10-CM

## 2023-11-20 ENCOUNTER — Other Ambulatory Visit: Payer: Self-pay | Admitting: Nurse Practitioner

## 2023-11-20 NOTE — Telephone Encounter (Signed)
 Copied from CRM [id number]. Topic: Clinical - Medication Refill >> Nov 20, 2023  4:07 PM Dennison Nancy wrote: Most Recent Primary Care Visit:  Provider: Bennie Pierini  Department: [facility] MED  Visit Type: OFFICE VISIT  Date: 09/25/2023  Medication: lisdexamfetamine (VYVANSE) 40 MG capsule (Starting on 11/24/2023)  Has the patient contacted their pharmacy? Yes , told patient to ask for an early refill  (Agent: If no, request that the patient contact the pharmacy for the refill. If patient does not wish to contact the pharmacy document the reason why and proceed with request.) (Agent: If yes, when and what did the pharmacy advise?)  Is this the correct pharmacy for this prescription? Yes If no, delete pharmacy and type the correct one.  This is the patient's preferred pharmacy:   [id number] - [place], [place] - [address] [place] [address] Phone: [phone] Fax: [phone]  Has the prescription been filled recently? No  Is the patient out of the medication? No  Has the patient been seen for an appointment in the last year OR does the patient have an upcoming appointment? Yes  Can we respond through MyChart? Yes  Agent: Please be advised that Rx refills may take up to 3 business days. We ask that you follow-up with your pharmacy.

## 2023-11-20 NOTE — Telephone Encounter (Signed)
 Last Fill: Pt reports she is unable to fill this script until 03/04 however she's going out of town early tomorrow and will not be back until 03/08. She is requesting provider authorize an early fill to [facility].    Routing to provider for review/authorization.

## 2023-11-23 MED ORDER — LISDEXAMFETAMINE DIMESYLATE 40 MG PO CAPS: 40.0000 mg | ORAL_CAPSULE | ORAL | 0 refills | Status: DC

## 2023-12-21 ENCOUNTER — Encounter: Payer: Self-pay | Admitting: Nurse Practitioner

## 2023-12-21 ENCOUNTER — Ambulatory Visit: Admitting: Nurse Practitioner

## 2023-12-21 DIAGNOSIS — F325 Major depressive disorder, single episode, in full remission: Secondary | ICD-10-CM

## 2023-12-21 MED ORDER — LISDEXAMFETAMINE DIMESYLATE 40 MG PO CAPS
40.0000 mg | ORAL_CAPSULE | ORAL | 0 refills | Status: AC
Start: 2023-12-23 — End: 2024-01-22

## 2023-12-21 MED ORDER — LISDEXAMFETAMINE DIMESYLATE 40 MG PO CAPS
40.0000 mg | ORAL_CAPSULE | ORAL | 0 refills | Status: AC
Start: 2024-01-22 — End: 2024-02-21

## 2023-12-21 MED ORDER — CITALOPRAM HYDROBROMIDE 40 MG PO TABS: 40.0000 mg | ORAL_TABLET | Freq: Every day | ORAL | 5 refills | Status: DC

## 2023-12-21 MED ORDER — LISDEXAMFETAMINE DIMESYLATE 40 MG PO CAPS: 40.0000 mg | ORAL_CAPSULE | ORAL | 0 refills | Status: AC

## 2023-12-21 NOTE — Progress Notes (Signed)
 Subjective:    Patient ID: Kayla Church, female    DOB: 03/15/2001, 23 y.o.   MRN: [id number]   Chief Complaint: adhd followup   HPI:  Kayla Church is a 23 y.o. who identifies as a female who was assigned female at birth.   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 Attention deficit hyperactivity disorder (ADHD), combined type Patient is on vyvanse and is doing well. No medication side effects.  2. Depression, major, single episode, complete remission (HCC) Is on celexa and is doing well. She has taken her daughter and left his daddy several weeks ago. He has been very mean  to her and wont give her any of her stuff     12/21/2023    2:21 PM 09/25/2023    3:29 PM 08/04/2023   12:14 PM 06/26/2023    2:26 PM  GAD 7 : Generalized Anxiety Score  Nervous, Anxious, on Edge 2 1 1 1   Control/stop worrying 2 1 0 2  Worry too much - different things 2 1 0 2  Trouble relaxing 2 2 0 2  Restless 0 0 1 0  Easily annoyed or irritable 1 2 0 2  Afraid - awful might happen 2 0 0 0  Total GAD 7 Score 11 7 2 9   Anxiety Difficulty Very difficult Somewhat difficult Somewhat difficult Somewhat difficult       12/21/2023    2:21 PM 09/25/2023    3:28 PM 08/04/2023   12:13 PM  Depression screen PHQ 2/9  Decreased Interest 2 3 0  Down, Depressed, Hopeless 1 1 0  PHQ - 2 Score 3 4 0  Altered sleeping 2 2 0  Tired, decreased energy 2 1 0  Change in appetite 0 0 0  Feeling bad or failure about yourself  1 0 0  Trouble concentrating 0 0 0  Moving slowly or fidgety/restless 1 0 0  Suicidal thoughts 1 0 0  PHQ-9 Score 10 7 0  Difficult doing work/chores Somewhat difficult Somewhat difficult Not difficult at all      3. BMI 30.0-30.9 Patient is on wegovy and is doing well.  Wt Readings from Last 3 Encounters:  12/21/23 150 lb (68 kg)  10/16/23 147 lb (66.7 kg)  10/15/23 147 lb (66.7  kg)   BMI Readings from Last 3 Encounters:  12/21/23 27.44 kg/m  10/16/23 26.89 kg/m  10/15/23 26.89 kg/m        New complaints: None today  No Known Allergies Outpatient Encounter Medications as of 12/21/2023  Medication Sig   acetaminophen (TYLENOL) 500 MG tablet Take 1 tablet (500 mg total) by mouth every 6 (six) hours as needed.   citalopram (CELEXA) 40 MG tablet Take 1 tablet (40 mg total) by mouth daily.   fluticasone (FLONASE) 50 MCG/ACT nasal spray Place 2 sprays into both nostrils daily.   lisdexamfetamine (VYVANSE) 40 MG capsule Take 1 capsule (40 mg total) by mouth every morning.   lisdexamfetamine (VYVANSE) 40 MG capsule Take 1 capsule (40 mg total) by mouth every morning.   lisdexamfetamine (VYVANSE) 40 MG capsule Take 1 capsule (40 mg total) by mouth every morning.   montelukast (SINGULAIR) 10 MG tablet Take 1 tablet (10 mg total) by mouth at bedtime.   Norethindrone-Ethinyl Estradiol-Fe Biphas (LO LOESTRIN FE) 1 MG-10 MCG / 10 MCG tablet Take 1 tablet by mouth daily.  ondansetron (ZOFRAN) 4 MG tablet Take 1 tablet (4 mg total) by mouth every 8 (eight) hours as needed for nausea or vomiting.   ondansetron (ZOFRAN-ODT) 4 MG disintegrating tablet Take 1 tablet (4 mg total) by mouth every 8 (eight) hours as needed for nausea or vomiting.   pantoprazole (PROTONIX) 40 MG tablet Take 1 tablet (40 mg total) by mouth daily. For stomach   Semaglutide-Weight Management (WEGOVY) 0.5 MG/0.5ML SOAJ Inject 0.5 mg into the skin once a week.   valACYclovir (VALTREX) 1000 MG tablet SMARTSIG:2 Tablet(s) By Mouth 1-2 Times Daily PRN   No facility-administered encounter medications on file as of 12/21/2023.    Past Surgical History:  Procedure Laterality Date   CHOLECYSTECTOMY     NO PAST SURGERIES      Family History  Problem Relation Age of Onset   Bipolar disorder Father       Controlled substance contract: n/a     Review of Systems  Constitutional:  Negative for  diaphoresis.  Eyes:  Negative for pain.  Respiratory:  Negative for shortness of breath.   Cardiovascular:  Negative for chest pain, palpitations and leg swelling.  Gastrointestinal:  Negative for abdominal pain.  Endocrine: Negative for polydipsia.  Skin:  Negative for rash.  Neurological:  Negative for dizziness, weakness and headaches.  Hematological:  Does not bruise/bleed easily.  All other systems reviewed and are negative.      Objective:   Physical Exam Vitals and nursing note reviewed.  Constitutional:      General: She is not in acute distress.    Appearance: Normal appearance. She is well-developed.  Neck:     Vascular: No carotid bruit or JVD.  Cardiovascular:     Rate and Rhythm: Normal rate and regular rhythm.     Heart sounds: Normal heart sounds.  Pulmonary:     Effort: Pulmonary effort is normal. No respiratory distress.     Breath sounds: Normal breath sounds. No wheezing or rales.  Chest:     Chest wall: No tenderness.  Abdominal:     General: Bowel sounds are normal. There is no distension or abdominal bruit.     Palpations: Abdomen is soft. There is no hepatomegaly, splenomegaly, mass or pulsatile mass.     Tenderness: There is no abdominal tenderness.  Musculoskeletal:        General: Normal range of motion.     Cervical back: Normal range of motion and neck supple.  Lymphadenopathy:     Cervical: No cervical adenopathy.  Skin:    General: Skin is warm and dry.  Neurological:     Mental Status: She is alert and oriented to person, place, and time.     Deep Tendon Reflexes: Reflexes are normal and symmetric.  Psychiatric:        Behavior: Behavior normal.        Thought Content: Thought content normal.        Judgment: Judgment normal.    BP 125/77   Pulse (!) 105   Temp 97.9 F (36.6 C) (Temporal)   Ht 5\' 2"  (1.575 m)   Wt 150 lb (68 kg)   BMI 27.44 kg/m          Assessment & Plan:   Kayla Church in today with chief complaint of  No chief complaint on file.   1. Attention deficit hyperactivity disorder (ADHD), combined type Continue vyvanse as prescribed  2. Depression, major, single episode, complete remission [facility]) Stress management Increase celexa  40mg  daily - citalopram (CELEXA) 40 MG tablet; Take 1 tablet (40 mg total) by mouth daily.  Dispense: 30 tablet; Refill: 5  3. BMI 29.0-29.9,adult Discussed diet and exercise for person with BMI >25 Will recheck weight in 3-6 months Okay to increase to 0.5mg  weekly    The above assessment and management plan was discussed with the patient. The patient verbalized understanding of and has agreed to the management plan. Patient is aware to call the clinic if symptoms persist or worsen. Patient is aware when to return to the clinic for a follow-up visit. Patient educated on when it is appropriate to go to the emergency department.   Mary-Margaret Daphine Deutscher, FNP

## 2023-12-22 ENCOUNTER — Ambulatory Visit: Payer: Medicaid Other | Admitting: Nurse Practitioner

## 2023-12-24 ENCOUNTER — Ambulatory Visit: Admitting: Nurse Practitioner

## 2024-01-12 ENCOUNTER — Other Ambulatory Visit: Payer: Self-pay | Admitting: Nurse Practitioner

## 2024-01-12 DIAGNOSIS — F325 Major depressive disorder, single episode, in full remission: Secondary | ICD-10-CM

## 2024-03-15 ENCOUNTER — Ambulatory Visit
Admission: EM | Admit: 2024-03-15 | Discharge: 2024-03-15 | Disposition: A | Discharge status: Home / Self Care | Attending: Family Medicine | Admitting: Family Medicine

## 2024-03-15 DIAGNOSIS — R112 Nausea with vomiting, unspecified: Secondary | ICD-10-CM

## 2024-03-15 DIAGNOSIS — R350 Frequency of micturition: Secondary | ICD-10-CM

## 2024-03-15 DIAGNOSIS — R197 Diarrhea, unspecified: Secondary | ICD-10-CM

## 2024-03-15 DIAGNOSIS — S39012A Strain of muscle, fascia and tendon of lower back, initial encounter: Secondary | ICD-10-CM

## 2024-03-15 LAB — POCT URINALYSIS DIP (MANUAL ENTRY)
Bilirubin, UA: NEGATIVE
Blood, UA: NEGATIVE
Glucose, UA: NEGATIVE mg/dL
Ketones, POC UA: NEGATIVE mg/dL
Leukocytes, UA: NEGATIVE
Nitrite, UA: NEGATIVE
Protein Ur, POC: NEGATIVE mg/dL
Spec Grav, UA: 1.025 (ref 1.010–1.025)
Urobilinogen, UA: 0.2 U/dL
pH, UA: 5.5 (ref 5.0–8.0)

## 2024-03-15 MED ORDER — IBUPROFEN 800 MG PO TABS
800.0000 mg | ORAL_TABLET | Freq: Every day | ORAL | 0 refills | Status: AC | PRN
Start: 2024-03-15 — End: ?

## 2024-03-15 MED ORDER — ONDANSETRON 8 MG PO TBDP: 8.0000 mg | ORAL_TABLET | Freq: Three times a day (TID) | ORAL | 0 refills | Status: AC | PRN

## 2024-03-15 NOTE — ED Triage Notes (Signed)
 Pt c/o nausea and back pain x 4 days. Also c/o urinary frequency. Some episodes of vomiting due to nausea.

## 2024-03-15 NOTE — Discharge Instructions (Addendum)
 Advised patient may take Zofran  daily or as needed for nausea.  Advised may take ibuprofen  daily or as needed for lumbar strain.  Advised patient to adhere to bland/brat diet for the next 2 to 3 days gradually returning to normal diet.  Encouraged to increase daily water intake to 64 ounces per day.  Advised we will follow-up with lab results once received.  Advised if symptoms worsen and/or unresolved please follow-up with your PCP or here for further evaluation.

## 2024-03-15 NOTE — ED Provider Notes (Signed)
 [facility]la [facility]    CSN: [id number] Arrival date & time: 03/15/24  1020      History   Chief Complaint Chief Complaint  Patient presents with   Nausea   Back Pain    HPI Kayla Church is a 23 y.o. female.   HPI 23 year old female presents with nausea and back pain for 4 days.  Additionally, complains of urinary frequency and episodes of vomiting due to nausea.  PMH significant for ADHD.  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 Authorizing Provider  ibuprofen  (ADVIL ) 800 MG tablet Take 1 tablet (800 mg total) by mouth daily as needed. 03/15/24  Yes Teddy Sharper, FNP  ondansetron  (ZOFRAN -ODT) 8 MG disintegrating tablet Take 1 tablet (8 mg total) by mouth every 8 (eight) hours as needed for nausea or vomiting. 03/15/24  Yes Kezia Benevides, FNP  acetaminophen  (TYLENOL ) 500 MG tablet Take 1 tablet (500 mg total) by mouth every 6 (six) hours as needed. 10/15/23   Nivia Colon, PA-C  citalopram  (CELEXA ) 40 MG tablet TAKE 1 TABLET BY MOUTH EVERY DAY 01/12/24   Gladis Mustard, FNP  fluticasone  (FLONASE ) 50 MCG/ACT nasal spray Place 2 sprays into both nostrils daily. 07/21/22    Gladis, Mary-Margaret, FNP  lisdexamfetamine (VYVANSE ) 40 MG capsule Take 1 capsule (40 mg total) by mouth every morning. 12/23/23 01/22/24  Gladis Mary-Margaret, FNP  lisdexamfetamine (VYVANSE ) 40 MG capsule Take 1 capsule (40 mg total) by mouth every morning. 01/22/24 02/21/24  Gladis Mary-Margaret, FNP  lisdexamfetamine (VYVANSE ) 40 MG capsule Take 1 capsule (40 mg total) by mouth every morning. 02/21/24 03/22/24  Gladis Mary-Margaret, FNP  montelukast  (SINGULAIR ) 10 MG tablet Take 1 tablet (10 mg total) by mouth at bedtime. 05/14/23   Gladis Mary-Margaret, FNP  Norethindrone-Ethinyl Estradiol-Fe Biphas (LO LOESTRIN FE) 1 MG-10 MCG / 10 MCG tablet Take 1 tablet by mouth daily. 12/04/22   [provider]  pantoprazole  (PROTONIX ) 40 MG tablet Take 1 tablet (40 mg total) by mouth daily. For stomach 04/08/23   Zollie Lowers, MD  Semaglutide -Weight Management (WEGOVY ) 0.5 MG/0.5ML SOAJ Inject 0.5 mg into the skin once a week. 08/25/23   Gladis Mary-Margaret, FNP  valACYclovir  (VALTREX ) 1000 MG tablet SMARTSIG:2 Tablet(s) By Mouth 1-2 Times Daily PRN    [provider]    Family History Family History  Problem Relation Age of Onset   Bipolar  disorder Father     Social History Social History   Tobacco Use   Smoking status: Every Day    Current packs/day: 0.25    Types: Cigarettes   Smokeless tobacco: Never   Tobacco comments:    pt reports 1-3 per day  Vaping Use   Vaping status: Never Used  Substance Use Topics   Alcohol  use: Yes    Comment: occ   Drug use: Yes    Types: Marijuana    Comment: pt reports she stopped mid December 2021     Allergies   Patient has no known allergies.   Review of Systems Review of Systems  Gastrointestinal:  Positive for nausea and vomiting.  Genitourinary:  Positive for frequency.  All other systems reviewed and are negative.    Physical Exam Triage Vital Signs ED Triage Vitals  Encounter Vitals Group     BP 03/15/24 1030 118/80      Girls Systolic BP Percentile --      Girls Diastolic BP Percentile --      Boys Systolic BP Percentile --      Boys Diastolic BP Percentile --      Pulse Rate 03/15/24 1030 68     Resp 03/15/24 1030 17     Temp 03/15/24 1030 98.8 F (37.1 C)     Temp Source 03/15/24 1030 Oral     SpO2 03/15/24 1030 98 %     Weight --      Height --      Head Circumference --      Peak Flow --      Pain Score 03/15/24 1031 8     Pain Loc --      Pain Education --      Exclude from Growth Chart --    No data found.  Updated Vital Signs BP 118/80 (BP Location: Right Arm)   Pulse 68   Temp 98.8 F (37.1 C) (Oral)   Resp 17   LMP 02/16/2024 (Approximate)   SpO2 98%   Breastfeeding No   Visual Acuity Right Eye Distance:   Left Eye Distance:   Bilateral Distance:    Right Eye Near:   Left Eye Near:    Bilateral Near:     Physical Exam Vitals and nursing note reviewed.  Constitutional:      Appearance: Normal appearance. She is normal weight.  HENT:     Head: Normocephalic and atraumatic.     Mouth/Throat:     Mouth: Mucous membranes are moist.     Pharynx: Oropharynx is clear.   Eyes:     Extraocular Movements: Extraocular movements intact.     Conjunctiva/sclera: Conjunctivae normal.     Pupils: Pupils are equal, round, and reactive to light.    Cardiovascular:     Rate and Rhythm: Normal rate and regular rhythm.     Pulses: Normal pulses.     Heart sounds: Normal heart sounds.  Pulmonary:     Effort: Pulmonary effort is normal.     Breath sounds: Normal breath sounds. No wheezing, rhonchi or rales.  Abdominal:     Tenderness: There is no right CVA tenderness or left CVA tenderness.   Musculoskeletal:        General: Normal range of motion.   Skin:    General: Skin is warm and dry.   Neurological:     General: No focal deficit present.     Mental Status: She is alert and oriented to person,  place, and time.   Psychiatric:        Mood and Affect: Mood normal.         Behavior: Behavior normal.      UC Treatments / Results  Labs (all labs ordered are listed, but only abnormal results are displayed) Labs Reviewed  COMPREHENSIVE METABOLIC PANEL WITH GFR  CBC WITH DIFFERENTIAL/PLATELET  POCT URINALYSIS DIP (MANUAL ENTRY)    EKG   Radiology No results found.  Procedures Procedures (including critical care time)  Medications Ordered in UC Medications - No data to display  Initial Impression / Assessment and Plan / UC Course  I have reviewed the triage vital signs and the nursing notes.  Pertinent labs & imaging results that were available during my care of the patient were reviewed by me and considered in my medical decision making (see chart for details).     MDM: 1.  Nausea vomiting and diarrhea-CBC with differential, CMP ordered, Rx'd Zofran  8 mg disintegrating tablet: Take 1 tablet every 8 hours, as needed for nausea or vomiting; 2.  Strain of lumbar region, initial encounter Rx'd Ibuprofen  800 mg tablet: Take 1 tablet daily, as needed for lumbar strain. Advised patient may take Zofran  daily or as needed for nausea.  Advised may take ibuprofen  daily or as needed for lumbar strain.  Advised patient to adhere to bland/brat diet for the next 2 to 3 days gradually returning to normal diet.  Encouraged to increase daily water intake to 64 ounces per day.  Advised we will follow-up with lab results once received.  Advised if symptoms worsen and/or unresolved please follow-up with your PCP or here for further evaluation.  Patient discharged home, hemodynamically stable.  Work note provided to patient per her request. Final Clinical Impressions(s) / UC Diagnoses   Final diagnoses:  Urinary frequency  Nausea vomiting and diarrhea  Strain of lumbar region, initial encounter     Discharge Instructions      Advised patient may take Zofran  daily or as needed for nausea.  Advised may take ibuprofen  daily or as needed for lumbar strain.   Advised patient to adhere to bland/brat diet for the next 2 to 3 days gradually returning to normal diet.  Encouraged to increase daily water intake to 64 ounces per day.  Advised we will follow-up with lab results once received.  Advised if symptoms worsen and/or unresolved please follow-up with your PCP or here for further evaluation.     ED Prescriptions     Medication Sig Dispense Auth. Provider   ibuprofen  (ADVIL ) 800 MG tablet Take 1 tablet (800 mg total) by mouth daily as needed. 21 tablet Mariaha Ellington, FNP   ondansetron  (ZOFRAN -ODT) 8 MG disintegrating tablet Take 1 tablet (8 mg total) by mouth every 8 (eight) hours as needed for nausea or vomiting. 24 tablet Parys Elenbaas, FNP      PDMP not reviewed this encounter.   Teddy Sharper, FNP 03/15/24 1129

## 2024-03-16 ENCOUNTER — Ambulatory Visit (HOSPITAL_COMMUNITY): Payer: Self-pay

## 2024-03-16 ENCOUNTER — Telehealth: Payer: Self-pay

## 2024-03-16 LAB — CBC WITH DIFFERENTIAL/PLATELET
Basophils Absolute: 0 10*3/uL (ref 0.0–0.2)
Basos: 0 %
EOS (ABSOLUTE): 0.3 10*3/uL (ref 0.0–0.4)
Eos: 2 %
Hematocrit: 42.3 % (ref 34.0–46.6)
Hemoglobin: 13.6 g/dL (ref 11.1–15.9)
Immature Grans (Abs): 0 10*3/uL (ref 0.0–0.1)
Immature Granulocytes: 0 %
Lymphocytes Absolute: 1.5 10*3/uL (ref 0.7–3.1)
Lymphs: 10 %
MCH: 30.8 pg (ref 26.6–33.0)
MCHC: 32.2 g/dL (ref 31.5–35.7)
MCV: 96 fL (ref 79–97)
Monocytes Absolute: 0.6 10*3/uL (ref 0.1–0.9)
Monocytes: 4 %
Neutrophils Absolute: 11.7 10*3/uL — ABNORMAL HIGH (ref 1.4–7.0)
Neutrophils: 84 %
Platelets: 248 10*3/uL (ref 150–450)
RBC: 4.42 x10E6/uL (ref 3.77–5.28)
RDW: 12.1 % (ref 11.7–15.4)
WBC: 14.1 10*3/uL — ABNORMAL HIGH (ref 3.4–10.8)

## 2024-03-16 LAB — COMPREHENSIVE METABOLIC PANEL WITH GFR
ALT: 18 IU/L (ref 0–32)
AST: 19 IU/L (ref 0–40)
Albumin: 4.4 g/dL (ref 4.0–5.0)
Alkaline Phosphatase: 66 IU/L (ref 44–121)
BUN/Creatinine Ratio: 15 (ref 9–23)
BUN: 11 mg/dL (ref 6–20)
Bilirubin Total: 0.6 mg/dL (ref 0.0–1.2)
CO2: 19 mmol/L — ABNORMAL LOW (ref 20–29)
Calcium: 8.9 mg/dL (ref 8.7–10.2)
Chloride: 104 mmol/L (ref 96–106)
Creatinine, Ser: 0.72 mg/dL (ref 0.57–1.00)
Globulin, Total: 2.4 g/dL (ref 1.5–4.5)
Glucose: 86 mg/dL (ref 70–99)
Potassium: 4.4 mmol/L (ref 3.5–5.2)
Sodium: 137 mmol/L (ref 134–144)
Total Protein: 6.8 g/dL (ref 6.0–8.5)
eGFR: 121 mL/min/{1.73_m2} (ref >59)

## 2024-03-16 NOTE — Telephone Encounter (Signed)
 There is a note in the chart from Morna Search indicating the lab results were reviewed, and no cause for alarm. As I reviewed the chart it looks like she has had 6 visits to the emergency room urgent care center for nausea vomiting diarrhea and just over 6 months.  She needs to follow-up with her PCP regarding her frequent GI symptoms. She has had a CBC done on at least 4 of these occasions, and her white count is always mildly elevated.  This also should be followed up with her PCP No action needed at this point

## 2024-03-16 NOTE — Telephone Encounter (Signed)
 Pt called, saw wbc count elevated, wants to know significance. Message sent to Dr. Maranda

## 2024-03-16 NOTE — Telephone Encounter (Signed)
 Pt notified of what Dr. Maranda stated  There is a note in the chart from Morna Search indicating the lab results were reviewed, and no cause for alarm. As I reviewed the chart it looks like she has had 6 visits to the emergency room urgent care center for nausea vomiting diarrhea and just over 6 months.  She needs to follow-up with her PCP regarding her frequent GI symptoms. She has had a CBC done on at least 4 of these occasions, and her white count is always mildly elevated.  This also should be followed up with her PCP No action needed at this point

## 2024-03-31 NOTE — Telephone Encounter (Unsigned)
 Copied from CRM [id number]. Topic: Clinical - Medication Refill >> Mar 31, 2024  3:39 PM Suzette B wrote: Medication: lisdexamfetamine (VYVANSE ) 40 MG capsule  Has the patient contacted their pharmacy? Yes advised the patient the presription had expired and was placed back on the shelf she needed to call and request a new one.  This is the patient's preferred pharmacy:  [id number] [address] [place], [place] [address] [place], [place] [address] [phone]   Is this the correct pharmacy for this prescription? Yes If no, delete pharmacy and type the correct one.   Has the prescription been filled recently? Yes  Is the patient out of the medication? Yes  Has the patient been seen for an appointment in the last year OR does the patient have an upcoming appointment? Yes  Can we respond through MyChart? Yes  Agent: Please be advised that Rx refills may take up to 3 business days. We ask that you follow-up with your pharmacy.

## 2024-04-07 ENCOUNTER — Other Ambulatory Visit: Payer: Self-pay | Admitting: Nurse Practitioner

## 2024-04-07 NOTE — Telephone Encounter (Unsigned)
 Copied from CRM [id number]. Topic: Clinical - Medication Refill >> Apr 07, 2024  9:19 AM Elle L wrote: Medication: lisdexamfetamine (VYVANSE ) 40 MG capsule  Has the patient contacted their pharmacy? Yes  This is the patient's preferred pharmacy:  [id number] [place], [place] - [address] [phone] [address] [place] [address]  Is this the correct pharmacy for this prescription? Yes  Has the prescription been filled recently? Yes  Is the patient out of the medication? Yes, she has been out for 2-3 weeks.   Has the patient been seen for an appointment in the last year OR does the patient have an upcoming appointment? Yes  Can we respond through MyChart? Yes  Agent: Please be advised that Rx refills may take up to 3 business days. We ask that you follow-up with your pharmacy.

## 2024-05-25 ENCOUNTER — Telehealth: Payer: Self-pay

## 2024-05-25 DIAGNOSIS — F988 Other specified behavioral and emotional disorders with onset usually occurring in childhood and adolescence: Secondary | ICD-10-CM

## 2024-05-25 NOTE — Progress Notes (Signed)
 Complex Care Management Note Care Guide Note  05/25/2024 Name: Kayla Church MRN: [id number] DOB: Apr 14, 2001   Complex Care Management Outreach Attempts: An unsuccessful telephone outreach was attempted today to offer the patient information about available complex care management services.  Follow Up Plan:  Additional outreach attempts will be made to offer the patient complex care management information and services.   Encounter Outcome:  Patient Request to Call Back  Dreama [facility]  [facility], [facility] VBCI Assistant Direct Dial: [phone]  Fax: [phone]

## 2024-05-31 NOTE — Progress Notes (Unsigned)
 Complex Care Management Note Care Guide Note  05/31/2024 Name: Kayla Church MRN: [id number] DOB: 2001/02/04   Complex Care Management Outreach Attempts: A second unsuccessful outreach was attempted today to offer the patient with information about available complex care management services.  Follow Up Plan:  Additional outreach attempts will be made to offer the patient complex care management information and services.   Encounter Outcome:  Patient Request to Call Back  Dreama [facility]  [facility], [facility] VBCI Assistant Direct Dial: [phone]  Fax: [phone]

## 2024-06-01 NOTE — Progress Notes (Signed)
 Complex Care Management Note Care Guide Note  06/01/2024 Name: Kayla Church MRN: [id number] DOB: 05-10-2001   Complex Care Management Outreach Attempts: A third unsuccessful outreach was attempted today to offer the patient with information about available complex care management services.  Follow Up Plan:  No further outreach attempts will be made at this time. We have been unable to contact the patient to offer or enroll patient in complex care management services.  Encounter Outcome:  No Answer  Dreama [facility]  [facility], [facility] VBCI Assistant Direct Dial: [phone]  Fax: [phone]

## 2024-08-04 ENCOUNTER — Ambulatory Visit: Admitting: Nurse Practitioner

## 2024-08-04 ENCOUNTER — Encounter: Payer: Self-pay | Admitting: Nurse Practitioner

## 2024-08-04 VITALS — BP 99/65 | HR 92 | Temp 97.1°F | Ht 62.0 in | Wt 171.0 lb

## 2024-08-04 DIAGNOSIS — R195 Other fecal abnormalities: Secondary | ICD-10-CM | POA: Diagnosis not present

## 2024-08-04 NOTE — Progress Notes (Signed)
   Subjective:    Patient ID: Kayla Church, female    DOB: 11-Feb-2001, 23 y.o.   MRN: [id number]   Chief Complaint: pale colored stools and Nausea   HPI  Patient comes in c/o pale colored stools and nausea. She had pancreatitis in the past. She denies any abdominal pain. Has had some diarrhea.   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 Negative for abdominal pain and constipation.       Objective:   Physical Exam Constitutional:      Appearance: Normal appearance.  Cardiovascular:     Rate and Rhythm: Normal rate and regular rhythm.     Heart sounds: Normal heart sounds.  Pulmonary:     Breath sounds: Normal breath sounds.  Abdominal:     General: Abdomen is flat. Bowel sounds are normal.     Palpations: Abdomen is soft.     Tenderness: There is no abdominal tenderness. There is no right CVA tenderness or guarding.  Neurological:     General: No focal deficit present.     Mental Status: She is alert and oriented to person, place, and time.  Psychiatric:        Mood and Affect: Mood normal.        Behavior: Behavior normal.    BP 99/65   Pulse 92   Temp (!) 97.1 F (36.2 C) (Temporal)   Ht 5' 2 (1.575 m)   Wt 171 lb (77.6 kg)   BMI 31.28 kg/m         Assessment & Plan:   Mignon L Mucci in today with chief complaint of pale colored stools and Nausea   1. Change in stool (Primary) Labs pending May do GI referral once we get lab results back - CMP14+EGFR - Lipase - Amylase    The above assessment and management plan was discussed with the patient. The patient verbalized understanding of and has agreed to the management  plan. Patient is aware to call the clinic if symptoms persist or worsen. Patient is aware when to return to the clinic for a follow-up visit. Patient educated on when it is appropriate to go to the emergency department.   Mary-Margaret Gladis, FNP

## 2024-08-05 LAB — LIPASE: Lipase: 16 U/L (ref 14–72)

## 2024-08-05 LAB — CMP14+EGFR
ALT: 15 IU/L (ref 0–32)
AST: 17 IU/L (ref 0–40)
Albumin: 4.5 g/dL (ref 4.0–5.0)
Alkaline Phosphatase: 60 IU/L (ref 41–116)
BUN/Creatinine Ratio: 11 (ref 9–23)
BUN: 8 mg/dL (ref 6–20)
Bilirubin Total: 0.3 mg/dL (ref 0.0–1.2)
CO2: 19 mmol/L — ABNORMAL LOW (ref 20–29)
Calcium: 9.1 mg/dL (ref 8.7–10.2)
Chloride: 105 mmol/L (ref 96–106)
Creatinine, Ser: 0.71 mg/dL (ref 0.57–1.00)
Globulin, Total: 2.5 g/dL (ref 1.5–4.5)
Glucose: 85 mg/dL (ref 70–99)
Potassium: 4.5 mmol/L (ref 3.5–5.2)
Sodium: 139 mmol/L (ref 134–144)
Total Protein: 7 g/dL (ref 6.0–8.5)
eGFR: 122 mL/min/1.73 (ref >59)

## 2024-08-05 LAB — AMYLASE: Amylase: 55 U/L (ref 31–110)

## 2024-08-08 ENCOUNTER — Ambulatory Visit: Payer: Self-pay | Admitting: Nurse Practitioner

## 2024-08-08 ENCOUNTER — Encounter: Payer: Self-pay | Admitting: Nurse Practitioner

## 2024-08-08 ENCOUNTER — Ambulatory Visit: Admitting: Nurse Practitioner

## 2024-08-08 DIAGNOSIS — F325 Major depressive disorder, single episode, in full remission: Secondary | ICD-10-CM

## 2024-08-08 MED ORDER — CITALOPRAM HYDROBROMIDE 40 MG PO TABS: 40.0000 mg | ORAL_TABLET | Freq: Every day | ORAL | 1 refills | Status: AC

## 2024-08-08 NOTE — Patient Instructions (Signed)

## 2024-08-08 NOTE — Progress Notes (Signed)
 Subjective:    Patient ID: Kayla Church, female    DOB: 2000-10-14, 23 y.o.   MRN: [id number]   Chief Complaint: ADHD and Mood swings (Thinks she may be bipolar. Her dad has it)   HPI  Patient comes in today to discuss mood swings- she has several members in her family dx with mood swings. Says one minute she can be fine and the next minute she is angry. They can be doing nothing and she looses her temper. She use to be on celexa  40mg  daily and it worked well, she just stopped taking it.     08/08/2024    8:43 AM 08/04/2024    3:24 PM 12/21/2023    2:21 PM  Depression screen PHQ 2/9  Decreased Interest 2 2 2   Down, Depressed, Hopeless 2 2 1   PHQ - 2 Score 4 4 3   Altered sleeping 2 3 2   Tired, decreased energy 3 1 2   Change in appetite 0 0 0  Feeling bad or failure about yourself  0 0 1  Trouble concentrating 0 0 0  Moving slowly or fidgety/restless 0 1 1  Suicidal thoughts 0 0 1  PHQ-9 Score 9 9 10    Difficult doing work/chores Somewhat difficult Somewhat difficult Somewhat difficult     Data saved with a previous flowsheet row definition    Patient Active Problem List   Diagnosis Date Noted   Depression, major, single episode, complete remission 05/31/2019   Psychoactive substance-induced psychosis (HCC) 02/06/2019   Drug-induced insomnia (HCC) 04/27/2018   Attention deficit hyperactivity disorder (ADHD), combined type 12/13/2015       Review of Systems  Constitutional:  Negative for diaphoresis.  Eyes:  Negative for pain.  Respiratory:  Negative for shortness of breath.   Cardiovascular:  Negative for chest pain, palpitations and leg swelling.  Gastrointestinal:  Negative for abdominal pain.  Endocrine: Negative for polydipsia.  Skin:  Negative for rash.  Neurological:  Negative for dizziness, weakness and headaches.  Hematological:  Does not bruise/bleed easily.  All other systems reviewed and are negative.      Objective:   Physical  Exam Constitutional:      Appearance: Normal appearance.  Cardiovascular:     Rate and Rhythm: Normal rate and regular rhythm.     Heart sounds: Normal heart sounds.  Pulmonary:     Effort: Pulmonary effort is normal.     Breath sounds: Normal breath sounds.  Skin:    General: Skin is warm.  Neurological:     General: No focal deficit present.     Mental Status: She is alert and oriented to person, place, and time.  Psychiatric:        Mood and Affect: Mood normal.        Behavior: Behavior normal.    BP 120/85   Pulse 72   Temp 97.7 F (36.5 C) (Temporal)   Ht 5' 2 (1.575 m)   Wt 176 lb (79.8 kg)   SpO2 96%   BMI 32.19 kg/m         Assessment & Plan:   Hermila L Kitamura in today with chief complaint of ADHD and Mood swings (Thinks she may be bipolar. Her dad has it)   1. Depression, major, single episode, complete remission Stress management RTO in 3-6 months - citalopram  (CELEXA ) 40 MG tablet; Take 1 tablet (40 mg total) by mouth daily.  Dispense: 90 tablet; Refill: 1    The above assessment and management  plan was discussed with the patient. The patient verbalized understanding of and has agreed to the management plan. Patient is aware to call the clinic if symptoms persist or worsen. Patient is aware when to return to the clinic for a follow-up visit. Patient educated on when it is appropriate to go to the emergency department.   Mary-Margaret Gladis, FNP
